# Patient Record
Sex: Male | Born: 1965 | Race: White | Hispanic: No | Marital: Married | Smoking: Former smoker
Health system: Southern US, Community
[De-identification: ages and names within clinical notes are randomized; demographics above are authoritative.]

## PROBLEM LIST (undated history)

## (undated) DIAGNOSIS — E669 Obesity, unspecified: Secondary | ICD-10-CM

## (undated) DIAGNOSIS — E8881 Metabolic syndrome: Secondary | ICD-10-CM

## (undated) DIAGNOSIS — G4730 Sleep apnea, unspecified: Secondary | ICD-10-CM

## (undated) DIAGNOSIS — G4733 Obstructive sleep apnea (adult) (pediatric): Secondary | ICD-10-CM

## (undated) DIAGNOSIS — I10 Essential (primary) hypertension: Secondary | ICD-10-CM

## (undated) HISTORY — DX: Obesity, unspecified: E66.9

## (undated) HISTORY — DX: Obstructive sleep apnea (adult) (pediatric): G47.33

## (undated) HISTORY — DX: Metabolic syndrome: E88.81

## (undated) HISTORY — DX: Essential (primary) hypertension: I10

## (undated) HISTORY — DX: Metabolic syndrome: E88.810

## (undated) HISTORY — DX: Sleep apnea, unspecified: G47.30

---

## 2003-02-08 HISTORY — PX: BACK SURGERY: SHX140

## 2004-03-30 ENCOUNTER — Ambulatory Visit (HOSPITAL_COMMUNITY): Admission: RE | Admit: 2004-03-30 | Discharge: 2004-03-30 | Payer: Self-pay | Admitting: Neurosurgery

## 2006-02-02 ENCOUNTER — Ambulatory Visit: Payer: Self-pay

## 2006-02-02 ENCOUNTER — Ambulatory Visit: Payer: Self-pay | Admitting: Cardiology

## 2014-03-12 ENCOUNTER — Ambulatory Visit (INDEPENDENT_AMBULATORY_CARE_PROVIDER_SITE_OTHER): Payer: Managed Care, Other (non HMO) | Admitting: Family Medicine

## 2014-03-12 VITALS — BP 117/64 | HR 75 | Temp 98.6°F | Ht 69.0 in | Wt 242.4 lb

## 2014-03-12 DIAGNOSIS — R0782 Intercostal pain: Secondary | ICD-10-CM

## 2014-03-12 DIAGNOSIS — M7918 Myalgia, other site: Secondary | ICD-10-CM

## 2014-03-12 MED ORDER — NAPROXEN 500 MG PO TABS: 500.0000 mg | ORAL_TABLET | Freq: Two times a day (BID) | ORAL | Status: DC

## 2014-03-12 NOTE — Progress Notes (Signed)
   Subjective:    Patient ID: Harold Bullock, male    DOB: 02/02/1966, 49 y.o.   MRN: [id number]  HPI Patient is here with c/o left costal discomfort.  Review of Systems  Constitutional: Negative for fever.  HENT: Negative for ear pain.   Eyes: Negative for discharge.  Respiratory: Negative for cough.   Cardiovascular: Negative for chest pain.  Gastrointestinal: Negative for abdominal distention.  Endocrine: Negative for polyuria.  Genitourinary: Negative for difficulty urinating.  Musculoskeletal: Negative for gait problem and neck pain.  Skin: Negative for color change and rash.  Neurological: Negative for speech difficulty and headaches.  Psychiatric/Behavioral: Negative for agitation.       Objective:    BP 117/64 mmHg  Pulse 75  Temp(Src) 98.6 F (37 C) (Oral)  Ht 5\' 9"  (1.753 m)  Wt 242 lb 6 oz (109.941 kg)  BMI 35.78 kg/m2 Physical Exam  +TTP left lateral intercostal space w/o crepitus of stepping off of costal.      Assessment & Plan:     ICD-9-CM ICD-10-CM   1. Intercostal muscle pain 786.59 R07.82 naproxen (NAPROSYN) 500 MG tablet     No Follow-up on file.  Deatra CanterWilliam J Oxford FNP

## 2014-12-13 ENCOUNTER — Ambulatory Visit (INDEPENDENT_AMBULATORY_CARE_PROVIDER_SITE_OTHER): Payer: Managed Care, Other (non HMO) | Admitting: Family Medicine

## 2014-12-13 ENCOUNTER — Encounter: Payer: Self-pay | Admitting: Family Medicine

## 2014-12-13 VITALS — BP 131/72 | HR 82 | Temp 98.5°F | Ht 69.0 in | Wt 223.4 lb

## 2014-12-13 DIAGNOSIS — R05 Cough: Secondary | ICD-10-CM

## 2014-12-13 DIAGNOSIS — H6981 Other specified disorders of Eustachian tube, right ear: Secondary | ICD-10-CM

## 2014-12-13 DIAGNOSIS — B3789 Other sites of candidiasis: Secondary | ICD-10-CM

## 2014-12-13 DIAGNOSIS — J329 Chronic sinusitis, unspecified: Secondary | ICD-10-CM | POA: Diagnosis not present

## 2014-12-13 DIAGNOSIS — R059 Cough, unspecified: Secondary | ICD-10-CM

## 2014-12-13 MED ORDER — AMOXICILLIN 500 MG PO CAPS: 500.0000 mg | ORAL_CAPSULE | Freq: Three times a day (TID) | ORAL | Status: DC

## 2014-12-13 NOTE — Progress Notes (Signed)
Subjective:    Patient ID: Harold Bullock, male    DOB: 05/03/1965, 49 y.o.   MRN: [id number]  HPI  Patient is here today with cough and congestion for about 2 weeks. Patient also feels like there is fluid in his right ear. Patient also states that he has had jock itch also for a couple of months. The patient denies fever. He says the congestion is clear to green in color. He has not had any headaches. He does smoke off and on. The patient has had the rash for a couple of months and his groin area and he complains of the fluid in his ear with his ear feeling like and stopped up. Family history is significant that his father died from complications of a hip fracture and his mother is at an assisted living facility was Alzheimer's.    Review of Systems  Constitutional: Negative.   HENT: Positive for congestion.        Fluid in right ear   Eyes: Negative.   Respiratory: Positive for cough.   Cardiovascular: Negative.   Gastrointestinal: Negative.   Endocrine: Negative.   Musculoskeletal: Negative.   Skin:       Skin irritation (Jock Itch)  Allergic/Immunologic: Negative.   Neurological: Negative.   Hematological: Negative.   Psychiatric/Behavioral: Negative.           There are no active problems to display for this patient.  Outpatient Encounter Prescriptions as of 12/13/2014  Medication Sig  . [DISCONTINUED] naproxen (NAPROSYN) 500 MG tablet Take 1 tablet (500 mg total) by mouth 2 (two) times daily with a meal. (Patient not taking: Reported on 12/13/2014)   No facility-administered encounter medications on file as of 12/13/2014.       Objective:   Physical Exam  Constitutional: He is oriented to person, place, and time. He appears well-developed and well-nourished. No distress.  HENT:  Head: Normocephalic and atraumatic.  Left Ear: External ear normal.  Mouth/Throat: Oropharynx is clear and moist. No oropharyngeal exudate.  Nasal congestion with turbinate swelling  bilaterally. The right TM is retracted. Ear canals are clear of cerumen  Eyes: Conjunctivae and EOM are normal. Pupils are equal, round, and reactive to light. Right eye exhibits no discharge. Left eye exhibits no discharge. No scleral icterus.  Neck: Normal range of motion. Neck supple. No thyromegaly present.  No anterior cervical adenopathy or tenderness  Cardiovascular: Normal rate, regular rhythm and normal heart sounds.   Pulmonary/Chest: Effort normal and breath sounds normal. No respiratory distress. He has no wheezes. He has no rales. He exhibits no tenderness.  Dry cough  Musculoskeletal: Normal range of motion.  Lymphadenopathy:    He has no cervical adenopathy.  Neurological: He is alert and oriented to person, place, and time.  Skin: Skin is warm and dry. Rash noted. There is erythema.  Psychiatric: He has a normal mood and affect. His behavior is normal. Judgment and thought content normal.  Nursing note and vitals reviewed.  BP 131/72 mmHg  Pulse 82  Temp(Src) 98.5 F (36.9 C) (Oral)  Ht  (1.753 m)  Wt 223 lb 6.4 oz (101.334 kg)  BMI 32.98 kg/m2         Assessment & Plan:  1. Rhinosinusitis -Use nasal saline frequently as directed and use Flonase at nighttime as directed - amoxicillin (AMOXIL) 500 MG capsule; Take 1 capsule (500 mg total) by mouth 3 (three) times daily.  Dispense: 30 capsule; Refill: 0  2. Eustachian tube  dysfunction, right -Use nasal saline and Flonase - amoxicillin (AMOXIL) 500 MG capsule; Take 1 capsule (500 mg total) by mouth 3 (three) times daily.  Dispense: 30 capsule; Refill: 0  3. Cough -Take Mucinex with a large glass of water twice daily  4. Candida rash of groin -Use Monistat to the Groin twice daily  Patient Instructions  Try to remain off of the cigarettes Drink plenty of fluids Use nasal saline frequently during the day and take Mucinex, maximum strength, blue and white in color, 1 twice daily with a large glass of water  for cough and congestion Use Flonase for allergic rhinitis 1 spray each nostril at bedtime Take antibiotic as directed until completed Use Monistat vaginal cream and apply sparingly twice daily to the groin area   Nyra Capeson W. Tavis Kring MD

## 2014-12-13 NOTE — Patient Instructions (Addendum)
Try to remain off of the cigarettes Drink plenty of fluids Use nasal saline frequently during the day and take Mucinex, maximum strength, blue and white in color, 1 twice daily with a large glass of water for cough and congestion Use Flonase for allergic rhinitis 1 spray each nostril at bedtime Take antibiotic as directed until completed Use Monistat vaginal cream and apply sparingly twice daily to the groin area

## 2015-01-05 ENCOUNTER — Ambulatory Visit (INDEPENDENT_AMBULATORY_CARE_PROVIDER_SITE_OTHER): Payer: Managed Care, Other (non HMO) | Admitting: Nurse Practitioner

## 2015-01-05 ENCOUNTER — Encounter: Payer: Self-pay | Admitting: Nurse Practitioner

## 2015-01-05 ENCOUNTER — Ambulatory Visit (INDEPENDENT_AMBULATORY_CARE_PROVIDER_SITE_OTHER): Payer: Managed Care, Other (non HMO)

## 2015-01-05 VITALS — BP 132/76 | HR 81 | Temp 97.1°F | Ht 69.0 in | Wt 225.0 lb

## 2015-01-05 DIAGNOSIS — Z23 Encounter for immunization: Secondary | ICD-10-CM

## 2015-01-05 DIAGNOSIS — Z125 Encounter for screening for malignant neoplasm of prostate: Secondary | ICD-10-CM | POA: Diagnosis not present

## 2015-01-05 DIAGNOSIS — Z0000 Encounter for general adult medical examination without abnormal findings: Secondary | ICD-10-CM

## 2015-01-05 DIAGNOSIS — Z6833 Body mass index (BMI) 33.0-33.9, adult: Secondary | ICD-10-CM | POA: Diagnosis not present

## 2015-01-05 DIAGNOSIS — Z1211 Encounter for screening for malignant neoplasm of colon: Secondary | ICD-10-CM | POA: Diagnosis not present

## 2015-01-05 DIAGNOSIS — G4733 Obstructive sleep apnea (adult) (pediatric): Secondary | ICD-10-CM | POA: Diagnosis not present

## 2015-01-05 NOTE — Progress Notes (Addendum)
   Subjective:    Patient ID: Harold Bullock, male    DOB: 10-01-1965, 49 y.o.   MRN: [id number]  HPI  Patient in today for annual physical exam- Has  Not ben to see a physician in several years- he currently has  o medical problems and is on no meds.His only compliant is he can hear his heart beat in his right ear- denies any pain or discharge.  Review of Systems  Constitutional: Negative.   HENT: Negative.   Respiratory: Negative.   Cardiovascular: Negative.   Gastrointestinal: Negative.   Genitourinary: Negative.   Musculoskeletal: Negative.   Neurological: Negative.   Psychiatric/Behavioral: Negative.   All other systems reviewed and are negative.      Objective:   Physical Exam  Constitutional: He is oriented to person, place, and time. He appears well-developed and well-nourished.  HENT:  Head: Normocephalic.  Right Ear: External ear normal.  Left Ear: External ear normal.  Nose: Nose normal.  Mouth/Throat: Oropharynx is clear and moist.  Eyes: EOM are normal. Pupils are equal, round, and reactive to light.  Neck: Normal range of motion. Neck supple. No JVD present. No thyromegaly present.  Cardiovascular: Normal rate, regular rhythm, normal heart sounds and intact distal pulses.  Exam reveals no gallop and no friction rub.   No murmur heard. Pulmonary/Chest: Effort normal and breath sounds normal. No respiratory distress. He has no wheezes. He has no rales. He exhibits no tenderness.  Abdominal: Soft. Bowel sounds are normal. He exhibits no mass. There is no tenderness.  Genitourinary: Prostate normal and penis normal.  Refuse digital rectal exam.  Musculoskeletal: Normal range of motion. He exhibits no edema.  Lymphadenopathy:    He has no cervical adenopathy.  Neurological: He is alert and oriented to person, place, and time. No cranial nerve deficit.  Skin: Skin is warm and dry.  Psychiatric: He has a normal mood and affect. His behavior is normal. Judgment and  thought content normal.   BP 132/76 mmHg  Pulse 81  Temp(Src) 97.1 F (36.2 C) (Oral)  Ht $R'5\' 9"'EL$  (1.753 m)  Wt 225 lb (102.059 kg)  BMI 33.21 kg/m2  Chest x ray- no cardiopulmonary abnormalities-Preliminary reading by Ronnald Collum, FNP  [facility]  EKG- NSR- [name] Hassell Done, FNP       Assessment & Plan:  1. Annual physical exam  - CBC with Differential/Platelet - CMP14+EGFR - Lipid panel - DG Chest 2 View; Future - EKG 12-Lead  2. BMI 33.0-33.9,adult Discussed diet and exercise for person with BMI >25 Will recheck weight in 3-6 months   3. Prostate cancer screening - PSA, total and free  4. Encounter for screening colonoscopy - Ambulatory referral to Gastroenterology  Counseling done on all immunizations given today Referral for sleep study Labs pending Health maintenance reviewed Diet and exercise encouraged Continue all meds Follow up  In 1 year   [place], FNP

## 2015-01-05 NOTE — Patient Instructions (Signed)

## 2015-01-05 NOTE — Addendum Note (Signed)
Addended by: Cleda DaubUCKER, AMANDA G on: 01/05/2015 11:02 AM   Modules accepted: Orders

## 2015-01-05 NOTE — Addendum Note (Signed)
Addended by: Prescott GumLAND, Lenford Beddow M on: 01/05/2015 10:22 AM   Modules accepted: Kipp BroodSmartSet

## 2015-01-05 NOTE — Addendum Note (Signed)
Addended by: Bennie PieriniMARTIN, [name] on: 01/05/2015 10:27 AM   Modules accepted: Kipp BroodSmartSet

## 2015-01-06 ENCOUNTER — Telehealth: Payer: Self-pay | Admitting: Family Medicine

## 2015-01-06 ENCOUNTER — Other Ambulatory Visit: Payer: Self-pay | Admitting: Nurse Practitioner

## 2015-01-06 ENCOUNTER — Encounter: Payer: Self-pay | Admitting: Internal Medicine

## 2015-01-06 LAB — LIPID PANEL
CHOL/HDL RATIO: 5.2 ratio — AB (ref 0.0–5.0)
Cholesterol, Total: 232 mg/dL — ABNORMAL HIGH (ref 100–199)
HDL: 45 mg/dL (ref >39)
LDL CALC: 144 mg/dL — AB (ref 0–99)
TRIGLYCERIDES: 214 mg/dL — AB (ref 0–149)
VLDL Cholesterol Cal: 43 mg/dL — ABNORMAL HIGH (ref 5–40)

## 2015-01-06 LAB — CBC WITH DIFFERENTIAL/PLATELET
BASOS: 1 %
Basophils Absolute: 0 10*3/uL (ref 0.0–0.2)
EOS (ABSOLUTE): 0.1 10*3/uL (ref 0.0–0.4)
Eos: 1 %
HEMATOCRIT: 42.4 % (ref 37.5–51.0)
HEMOGLOBIN: 14.3 g/dL (ref 12.6–17.7)
Immature Grans (Abs): 0 10*3/uL (ref 0.0–0.1)
Immature Granulocytes: 0 %
LYMPHS ABS: 0.8 10*3/uL (ref 0.7–3.1)
LYMPHS: 15 %
MCH: 32.5 pg (ref 26.6–33.0)
MCHC: 33.7 g/dL (ref 31.5–35.7)
MCV: 96 fL (ref 79–97)
MONOS ABS: 0.3 10*3/uL (ref 0.1–0.9)
Monocytes: 7 %
Neutrophils Absolute: 3.9 10*3/uL (ref 1.4–7.0)
Neutrophils: 76 %
Platelets: 230 10*3/uL (ref 150–379)
RBC: 4.4 x10E6/uL (ref 4.14–5.80)
RDW: 13.2 % (ref 12.3–15.4)
WBC: 5 10*3/uL (ref 3.4–10.8)

## 2015-01-06 LAB — PSA, TOTAL AND FREE
PSA FREE PCT: 57.5 %
PSA FREE: 0.23 ng/mL
Prostate Specific Ag, Serum: 0.4 ng/mL (ref 0.0–4.0)

## 2015-01-06 LAB — CMP14+EGFR
A/G RATIO: 1.8 (ref 1.1–2.5)
ALBUMIN: 4.6 g/dL (ref 3.5–5.5)
ALK PHOS: 91 IU/L (ref 39–117)
ALT: 30 IU/L (ref 0–44)
AST: 22 IU/L (ref 0–40)
BUN / CREAT RATIO: 14 (ref 9–20)
BUN: 13 mg/dL (ref 6–24)
Bilirubin Total: 0.3 mg/dL (ref 0.0–1.2)
CALCIUM: 10.1 mg/dL (ref 8.7–10.2)
CO2: 24 mmol/L (ref 18–29)
CREATININE: 0.94 mg/dL (ref 0.76–1.27)
Chloride: 97 mmol/L (ref 97–106)
GFR calc Af Amer: 109 mL/min/{1.73_m2} (ref >59)
GFR, EST NON AFRICAN AMERICAN: 94 mL/min/{1.73_m2} (ref >59)
GLOBULIN, TOTAL: 2.5 g/dL (ref 1.5–4.5)
Glucose: 119 mg/dL — ABNORMAL HIGH (ref 65–99)
POTASSIUM: 4.4 mmol/L (ref 3.5–5.2)
SODIUM: 138 mmol/L (ref 136–144)
Total Protein: 7.1 g/dL (ref 6.0–8.5)

## 2015-01-06 MED ORDER — ATORVASTATIN CALCIUM 40 MG PO TABS: 40.0000 mg | ORAL_TABLET | Freq: Every day | ORAL | Status: DC

## 2015-01-07 ENCOUNTER — Encounter: Payer: Self-pay | Admitting: Family Medicine

## 2015-02-12 ENCOUNTER — Encounter: Payer: Self-pay | Admitting: Internal Medicine

## 2015-02-12 ENCOUNTER — Encounter: Payer: Self-pay | Admitting: Nurse Practitioner

## 2015-02-12 ENCOUNTER — Ambulatory Visit (INDEPENDENT_AMBULATORY_CARE_PROVIDER_SITE_OTHER): Payer: Managed Care, Other (non HMO) | Admitting: Nurse Practitioner

## 2015-02-12 VITALS — BP 134/82 | HR 87 | Temp 96.9°F | Ht 69.0 in | Wt 226.0 lb

## 2015-02-12 DIAGNOSIS — B356 Tinea cruris: Secondary | ICD-10-CM

## 2015-02-12 MED ORDER — NYSTATIN 100000 UNIT/GM EX CREA: 1.0000 "application " | TOPICAL_CREAM | Freq: Two times a day (BID) | CUTANEOUS | Status: DC

## 2015-02-12 MED ORDER — FLUCONAZOLE 150 MG PO TABS: ORAL_TABLET | ORAL | Status: DC

## 2015-02-12 NOTE — Patient Instructions (Signed)
Jock Itch  Jock itch is an infection of the skin in the groin area. It is caused by a type of germ (fungus). The infection causes a rash and itching in the groin and upper thigh. It is common in people who play sports. Being in places with hot weather and wearing tight or wet clothes can increase the chance of getting jock itch. The rash usually goes away in 2-3 weeks with treatment.  HOME CARE  · Take medicines only as told by your doctor. Apply skin creams or ointments exactly as told.  · Wear loose-fitting clothing.    Men should wear cotton boxer shorts.    Women should wear cotton underwear.  · Change your underwear every day to keep your groin dry.  · Avoid hot baths.  · Dry your groin area well after you take a bath or shower.    Use a separate towel to dry your groin area. This will help to prevent a spreading of the infection to other areas of your body.  · Do not scratch the affected area.  · Do not share towels with other people.  GET HELP IF:  · Your rash does not improve or it gets worse after 2 weeks of treatment.  · Your rash is spreading.  · Your rash comes back after treatment is finished.  · You have a fever.  · You have redness, swelling, or pain in the area around your rash.  · You have fluid, blood, or pus coming from your rash.  · Your have your rash for more than 4 weeks.     This information is not intended to replace advice given to you by your health care provider. Make sure you discuss any questions you have with your health care provider.     Document Released: 04/20/2009 Document Revised: 02/14/2014 Document Reviewed: 11/05/2013  Elsevier Interactive Patient Education ©2016 [employer]

## 2015-02-12 NOTE — Progress Notes (Signed)
   Subjective:    Patient ID: Harold Bullock, male    DOB: 05/18/1965, 50 y.o.   MRN: [id number]  HPI  Patient in c/o rash in groin area- he has had since September- Was seen a couple of months ago and told him to use vagasil topically on it- made it worse- has been using gold bond which helps some with itching.   Review of Systems  Constitutional: Negative.   HENT: Negative.   Respiratory: Negative.   Cardiovascular: Negative.   Gastrointestinal: Negative.   Genitourinary: Negative.   Neurological: Negative.   Psychiatric/Behavioral: Negative.   All other systems reviewed and are negative.      Objective:   Physical Exam  Constitutional: He appears well-developed and well-nourished.  Cardiovascular: Normal rate, regular rhythm and normal heart sounds.   Pulmonary/Chest: Effort normal and breath sounds normal.  Skin: Skin is warm. Rash noted.  Erythematous wet rash in bil groin area   BP 134/82 mmHg  Pulse 87  Temp(Src) 96.9 F (36.1 C) (Oral)  Ht 5\' 9"  (1.753 m)  Wt 226 lb (102.513 kg)  BMI 33.36 kg/m2         Assessment & Plan:  1. Jock itch Keep area as dry as possible Avoid scratching - nystatin cream (MYCOSTATIN); Apply 1 application topically 2 (two) times daily.  Dispense: 30 g; Refill: 0 - fluconazole (DIFLUCAN) 150 MG tablet; 1 po q week x 4 weeks  Dispense: 4 tablet; Refill: 0  [name] Daphine DeutscherMartin, FNP

## 2015-02-23 ENCOUNTER — Other Ambulatory Visit: Payer: Self-pay | Admitting: Nurse Practitioner

## 2015-03-02 ENCOUNTER — Encounter: Payer: Self-pay | Admitting: Internal Medicine

## 2015-04-13 ENCOUNTER — Encounter: Payer: Self-pay | Admitting: Internal Medicine

## 2015-04-22 ENCOUNTER — Encounter: Payer: Self-pay | Admitting: Pulmonary Disease

## 2015-04-22 ENCOUNTER — Ambulatory Visit (INDEPENDENT_AMBULATORY_CARE_PROVIDER_SITE_OTHER): Payer: Managed Care, Other (non HMO) | Admitting: Pulmonary Disease

## 2015-04-22 VITALS — BP 148/86 | HR 76 | Ht 69.0 in | Wt 232.0 lb

## 2015-04-22 DIAGNOSIS — G4733 Obstructive sleep apnea (adult) (pediatric): Secondary | ICD-10-CM

## 2015-04-22 DIAGNOSIS — Z23 Encounter for immunization: Secondary | ICD-10-CM | POA: Diagnosis not present

## 2015-04-22 NOTE — Progress Notes (Signed)
   Subjective:    Patient ID: Harold Bullock, male    DOB: 03/12/1965, 50 y.o.   MRN: [id number]  HPI    Review of Systems  Constitutional: Negative for fever and unexpected weight change.  HENT: Negative for congestion, dental problem, ear pain, nosebleeds, postnasal drip, rhinorrhea, sinus pressure, sneezing, sore throat and trouble swallowing.   Eyes: Negative for redness and itching.  Respiratory: Negative for cough, chest tightness, shortness of breath and wheezing.   Cardiovascular: Negative for palpitations and leg swelling.  Gastrointestinal: Negative for nausea and vomiting.  Genitourinary: Negative for dysuria.  Musculoskeletal: Negative for joint swelling.  Skin: Negative for rash.  Neurological: Negative for headaches.  Hematological: Does not bruise/bleed easily.  Psychiatric/Behavioral: Negative for dysphoric mood. The patient is not nervous/anxious.        Objective:   Physical Exam        Assessment & Plan:

## 2015-04-22 NOTE — Progress Notes (Signed)
Past Medical History He  has a past medical history of Sleep apnea.  Past Surgical History He  has past surgical history that includes Back surgery (2005 ).  No current outpatient prescriptions on file prior to visit.   No current facility-administered medications on file prior to visit.    No Known Allergies  Family History His family history includes Alzheimer's disease in his mother; Cancer in his maternal grandfather, maternal uncle, and paternal grandmother.  Social History He  reports that he has been smoking Cigarettes.  He has a 20 pack-year smoking history. He does not have any smokeless tobacco history on file. He reports that he drinks alcohol. He reports that he does not use illicit drugs.  Review of systems Constitutional: Negative for fever and unexpected weight change.  HENT: Negative for congestion, dental problem, ear pain, nosebleeds, postnasal drip, rhinorrhea, sinus pressure, sneezing, sore throat and trouble swallowing.   Eyes: Negative for redness and itching.  Respiratory: Negative for cough, chest tightness, shortness of breath and wheezing.   Cardiovascular: Negative for palpitations and leg swelling.  Gastrointestinal: Negative for nausea and vomiting.  Genitourinary: Negative for dysuria.  Musculoskeletal: Negative for joint swelling.  Skin: Negative for rash.  Neurological: Negative for headaches.  Hematological: Does not bruise/bleed easily.  Psychiatric/Behavioral: Negative for dysphoric mood. The patient is not nervous/anxious.     Chief Complaint  Patient presents with  . SLEEP CONSULT    Referred by Dr [name]. Uses CPAP nihgtly. Needs new DME. Sleep study 10+years ago. Epworth Score: 10    Tests:  Vital signs BP 148/86 mmHg  Pulse 76  Ht  (1.753 m)  Wt 232 lb (105.235 kg)  BMI 34.24 kg/m2  SpO2 97%  History of Present Illness Harold Bullock is a 50 y.o. male for evaluation of sleep problems.  He had sleep study about 10 yrs  ago at [facility]  He has been using CPAP since.  His current machine is about 81 or 51 years old.  He gets his supplies on line, but has not gotten a new mask for some time.  His sleep is okay with CPAP.  Before CPAP he would have snoring, apnea, and frequent arousals.  He would also talk in his sleep and grind his teeth. He used to get jumpy legs, but not recently.  He goes to sleep at 10 pm.  He falls asleep quickly.  He wakes up some times to use the bathroom.  He gets out of bed at 530 am.  He feels rested in the morning.  He denies morning headache.  He does not use anything to help him fall sleep or stay awake.  He denies sleep walking, or nightmares.  There is no history of restless legs.  He denies sleep hallucinations, sleep paralysis, or cataplexy.  The Epworth score is 10 out of 24.   Physical Exam:  General - No distress ENT - No sinus tenderness, no oral exudate, no LAN, no thyromegaly, TM clear, pupils equal/reactive, MP 3, enlarged tongue, high arched palate Cardiac - s1s2 regular, no murmur, pulses symmetric Chest - No wheeze/rales/dullness, good air entry, normal respiratory excursion Back - No focal tenderness Abd - Soft, non-tender, no organomegaly, + bowel sounds Ext - No edema Neuro - Normal strength, cranial nerves intact Skin - No rashes Psych - Normal mood, and behavior  Discussion: He has hx of obstructive sleep apnea.  He has been using CPAP, but his device is quite old.  He will  need to have re-assessment of the status of his sleep apnea, and then arrange for new machine and supplies.  We discussed how sleep apnea can affect various health problems, including risks for hypertension, cardiovascular disease, and diabetes.  We also discussed how sleep disruption can increase risks for accidents, such as while driving.  Weight loss as a means of improving sleep apnea was also reviewed.  Additional treatment options discussed were CPAP therapy, oral appliance, and  surgical intervention.   Assessment/plan:  Obstructive sleep apnea. Plan: - will arrange for home sleep study - assuming he still has sleep apnea, will then arrange for new auto CPAP machine and new supplies >> will need to set him up with DME for supplies    Patient Instructions  Will arrange for home sleep study Will call to arrange for follow up after sleep study reviewed      Coralyn HellingVineet Tachina Spoonemore, M.D. Pager [phone]

## 2015-04-22 NOTE — Patient Instructions (Signed)
Will arrange for home sleep study Will call to arrange for follow up after sleep study reviewed  

## 2015-04-24 ENCOUNTER — Telehealth: Payer: Self-pay | Admitting: Pulmonary Disease

## 2015-04-24 NOTE — Telephone Encounter (Signed)
LMTCB x 1 

## 2015-04-27 NOTE — Telephone Encounter (Signed)
Spoke with [place] Monmarya and advised it was a home sleep study that was ordered  She states this is all she needed  Will close encounter

## 2015-05-06 ENCOUNTER — Telehealth: Payer: Self-pay | Admitting: Pulmonary Disease

## 2015-05-06 NOTE — Telephone Encounter (Signed)
I called patient to get his HST rescheduled.  Pt states it is hard to come pick up the HST machine due to his work schedule.  I offered for his spouse/significant other to come pick it up for him.  Patient will check with spouse/significant other to see if she would feel comfortable doing this and call me back to let me know.

## 2015-05-13 NOTE — Telephone Encounter (Signed)
Pts wife Harold StanleyLisa called and scheduled appt for 05/20/15 to pick HST machine for patient.

## 2015-05-20 DIAGNOSIS — G4733 Obstructive sleep apnea (adult) (pediatric): Secondary | ICD-10-CM

## 2015-05-21 ENCOUNTER — Other Ambulatory Visit: Payer: Self-pay | Admitting: *Deleted

## 2015-05-21 ENCOUNTER — Telehealth: Payer: Self-pay | Admitting: Pulmonary Disease

## 2015-05-21 ENCOUNTER — Encounter: Payer: Self-pay | Admitting: Pulmonary Disease

## 2015-05-21 DIAGNOSIS — G4733 Obstructive sleep apnea (adult) (pediatric): Secondary | ICD-10-CM | POA: Insufficient documentation

## 2015-05-21 HISTORY — DX: Obstructive sleep apnea (adult) (pediatric): G47.33

## 2015-05-21 NOTE — Telephone Encounter (Signed)
HST 05/20/15 >> AHI 67.9, SaO2 low 71%  Will have my nurse inform pt that sleep study shows severe sleep apnea.  Options are 1) CPAP now, 2) ROV first.  If He is agreeable to CPAP, then please send order for auto CPAP range 5 to 15 cm H2O with heated humidity and mask of choice.  Have download sent 1 month after starting CPAP and set up ROV 2 months after starting CPAP.  ROV can by with me or nurse practitioner.

## 2015-05-26 NOTE — Telephone Encounter (Signed)
ATC pt x 1 Mailbox not set up.  wcb

## 2015-06-01 NOTE — Telephone Encounter (Signed)
ATC x2  Unable to leave voicemail - vmail not activated. wcb

## 2015-06-04 ENCOUNTER — Telehealth: Payer: Self-pay | Admitting: Pulmonary Disease

## 2015-06-04 NOTE — Telephone Encounter (Signed)
Pt calling wanting to know how many times he quit breathing during the night Harold HellingVineet Sood, MD at 05/21/2015 10:07 AM     Status: Signed       Expand All Collapse All   HST 05/20/15 >> AHI 67.9, SaO2 low 71%  Will have my nurse inform pt that sleep study shows severe sleep apnea.      Discussed details given by VS.  Nothing further needed.

## 2015-06-04 NOTE — Telephone Encounter (Signed)
ATC x 3 Unable to leave voicemail - vmail not activated. wcb  Called pt on cell #, requests that we do not call him on his home #, only cell. Numbers have been switched around in chart according to priority. Results have been explained to patient, pt expressed understanding.  Order placed for CPAP machine. Appt schedule with TP for 69mo ROV for CPAP compliance 08/10/15 at 9:15am. Nothing further needed.

## 2015-08-10 ENCOUNTER — Encounter (INDEPENDENT_AMBULATORY_CARE_PROVIDER_SITE_OTHER): Payer: Self-pay

## 2015-08-10 ENCOUNTER — Ambulatory Visit (INDEPENDENT_AMBULATORY_CARE_PROVIDER_SITE_OTHER): Payer: Managed Care, Other (non HMO) | Admitting: Adult Health

## 2015-08-10 ENCOUNTER — Encounter: Payer: Self-pay | Admitting: Adult Health

## 2015-08-10 VITALS — BP 140/80 | HR 82 | Ht 69.0 in | Wt 227.0 lb

## 2015-08-10 DIAGNOSIS — G4733 Obstructive sleep apnea (adult) (pediatric): Secondary | ICD-10-CM | POA: Diagnosis not present

## 2015-08-10 NOTE — Patient Instructions (Signed)
Continue on CPAP At bedtime   Keep up great job Work on weight loss.  Do not drive if sleepy  Follow up Dr. Craige CottaSood  In 1 year and As needed

## 2015-08-10 NOTE — Progress Notes (Signed)
Subjective:    Patient ID: Harold OverlieJohn K Bullock, male    DOB: 07/13/1965, 50 y.o.   MRN: [id number]  HPI 50 year old male seen for sleep consult March 2017, she had known sleep apnea on C Pap for many years. He needed a new C Pap machine  TEST  HST 05/20/15 >> AHI 67.9, SaO2 low 71%   08/10/2015 follow-up sleep apnea Patient returns for a four-month follow-up. Patient was recently seen establish for sleep apnea.  He had been on C Pap for many years. He needed a new C Pap machine. Patient was set up for a home sleep study 05/20/2015 that showed severe sleep apnea with AHI 67.9, SaO2 low at 71%.  Patient was set up with a new C Pap machine on AutoSet 5-15 cm of H2O. Download shows excellent compliance with 100% usage. On average. Uses at 7.5 hours. AHI 0.9. Minimal leaks.  Says he is doing very well on C Pap. He feels rested.  He denies any chest pain, orthopnea, PND, or increased leg swelling.   Past Medical History  Diagnosis Date  . Sleep apnea   . OSA (obstructive sleep apnea) 05/21/2015   Current Outpatient Prescriptions on File Prior to Visit  Medication Sig Dispense Refill  . atorvastatin (LIPITOR) 40 MG tablet Take 40 mg by mouth daily.  1   No current facility-administered medications on file prior to visit.     Review of Systems Constitutional:   No  weight loss, night sweats,  Fevers, chills, fatigue, or  lassitude.  HEENT:   No headaches,  Difficulty swallowing,  Tooth/dental problems, or  Sore throat,                No sneezing, itching, ear ache, nasal congestion, post nasal drip,   CV:  No chest pain,  Orthopnea, PND, swelling in lower extremities, anasarca, dizziness, palpitations, syncope.   GI  No heartburn, indigestion, abdominal pain, nausea, vomiting, diarrhea, change in bowel habits, loss of appetite, bloody stools.   Resp: No shortness of breath with exertion or at rest.  No excess mucus, no productive cough,  No non-productive cough,  No coughing up of blood.   No change in color of mucus.  No wheezing.  No chest wall deformity  Skin: no rash or lesions.  GU: no dysuria, change in color of urine, no urgency or frequency.  No flank pain, no hematuria   MS:  No joint pain or swelling.  No decreased range of motion.  No back pain.  Psych:  No change in mood or affect. No depression or anxiety.  No memory loss.         Objective:   Physical Exam Filed Vitals:   08/10/15 0922  BP: 140/80  Pulse: 82  Height: 5\' 9"  (1.753 m)  Weight: 227 lb (102.967 kg)  SpO2: 97%  Body mass index is 33.51 kg/(m^2).   GEN: A/Ox3; pleasant , NAD, obese   HEENT:  [place]/AT,  EACs-clear, TMs-wnl, NOSE-clear, THROAT-clear, no lesions, no postnasal drip or exudate noted. Class 3 MP airway   NECK:  Supple w/ fair ROM; no JVD; normal carotid impulses w/o bruits; no thyromegaly or nodules palpated; no lymphadenopathy.  RESP  Clear  P & A; w/o, wheezes/ rales/ or rhonchi.no accessory muscle use, no dullness to percussion  CARD:  RRR, no m/r/g  , no peripheral edema, pulses intact, no cyanosis or clubbing.  GI:   Soft & nt; nml bowel sounds; no organomegaly or masses detected.  Musco: Warm bil, no deformities or joint swelling noted.   Neuro: alert, no focal deficits noted.    Skin: Warm, no lesions or rashes  Tammy Parrett NP-C  [facility]  [phone]         Assessment & Plan:

## 2015-08-10 NOTE — Assessment & Plan Note (Signed)
Severe OSA well controlled  Plan  Continue on CPAP At bedtime   Keep up great job Work on weight loss.  Do not drive if sleepy  Follow up Dr. Craige CottaSood  In 1 year and As needed

## 2015-08-14 ENCOUNTER — Other Ambulatory Visit: Payer: Self-pay | Admitting: Nurse Practitioner

## 2015-08-16 NOTE — Progress Notes (Signed)
Reviewed and agree with assessment/plan.  Seidy Labreck, MD [facility] 08/16/2015, 6:50 PM Pager:  [phone]  

## 2015-08-19 ENCOUNTER — Encounter: Payer: Self-pay | Admitting: Adult Health

## 2016-03-29 ENCOUNTER — Encounter: Payer: Self-pay | Admitting: Nurse Practitioner

## 2016-03-29 ENCOUNTER — Ambulatory Visit (INDEPENDENT_AMBULATORY_CARE_PROVIDER_SITE_OTHER): Payer: 59 | Admitting: Nurse Practitioner

## 2016-03-29 VITALS — BP 127/74 | HR 78 | Temp 97.2°F | Ht 69.0 in | Wt 226.0 lb

## 2016-03-29 DIAGNOSIS — J0101 Acute recurrent maxillary sinusitis: Secondary | ICD-10-CM | POA: Diagnosis not present

## 2016-03-29 MED ORDER — CHLORPHEN-PE-ACETAMINOPHEN 4-10-325 MG PO TABS: 1.0000 | ORAL_TABLET | Freq: Four times a day (QID) | ORAL | 0 refills | Status: DC | PRN

## 2016-03-29 MED ORDER — AZITHROMYCIN 250 MG PO TABS: ORAL_TABLET | ORAL | 0 refills | Status: DC

## 2016-03-29 NOTE — Progress Notes (Signed)
Subjective:     Harold Bullock is a 51 y.o. male who presents for evaluation of sinus pain. Symptoms include: congestion, cough, facial pain, headaches, nasal congestion, post nasal drip and sinus pressure. Onset of symptoms was 2 days ago. Symptoms have been gradually worsening since that time. Past history is significant for occasional episodes of bronchitis. Patient is a smoker  (1 ppd x 20 yrs).  The following portions of the patient's history were reviewed and updated as appropriate: allergies, current medications, past family history, past medical history, past social history, past surgical history and problem list.  Review of Systems Pertinent items noted in HPI and remainder of comprehensive ROS otherwise negative.   Objective:    BP 127/74   Pulse 78   Temp 97.2 F (36.2 C) (Oral)   Ht 5\' 9"  (1.753 m)   Wt 226 lb (102.5 kg)   BMI 33.37 kg/m  General appearance: alert, cooperative and mild distress Eyes: conjunctivae/corneas clear. PERRL, EOM's intact. Fundi benign. Ears: normal TM's and external ear canals both ears Nose: clear discharge, moderate congestion, turbinates red, sinus tenderness bilateral Throat: lips, mucosa, and tongue normal; teeth and gums normal Neck: no adenopathy, no carotid bruit, no JVD, supple, symmetrical, trachea midline and thyroid not enlarged, symmetric, no tenderness/mass/nodules Lungs: clear to auscultation bilaterally Heart: regular rate and rhythm, S1, S2 normal, no murmur, click, rub or gallop    Assessment:    Acute bacterial sinusitis.    Plan:     1. Take meds as prescribed 2. Use a cool mist humidifier especially during the winter months and when heat has been humid. 3. Use saline nose sprays frequently 4. Saline irrigations of the nose can be very helpful if done frequently.  * 4X daily for 1 week*  * Use of a nettie pot can be helpful with this. Follow directions with this* 5. Drink plenty of fluids 6. Keep thermostat turn  down low 7.For any cough or congestion  Use plain Mucinex- regular strength or max strength is fine   * Children- consult with Pharmacist for dosing 8. For fever or aces or pains- take tylenol or ibuprofen appropriate for age and weight.  * for fevers greater than 101 orally you may alternate ibuprofen and tylenol every  3 hours.   Meds ordered this encounter  Medications  . azithromycin (ZITHROMAX) 250 MG tablet    Sig: Two tablets day one, then one tablet daily next 4 days.    Dispense:  6 tablet    Refill:  0    Order Specific Question:   Supervising Provider    Answer:   VINCENT, CAROL L [[id number]]  . Chlorphen-PE-Acetaminophen 4-10-325 MG TABS    Sig: Take 1 tablet by mouth every 6 (six) hours as needed.    Dispense:  10 tablet    Refill:  0    Order Specific Question:   Supervising Provider    Answer:   Johna SheriffVINCENT, CAROL L [[id number]]   [name] Daphine DeutscherMartin, FNP

## 2016-03-29 NOTE — Patient Instructions (Signed)

## 2016-10-04 ENCOUNTER — Encounter: Payer: Self-pay | Admitting: Family Medicine

## 2016-10-04 ENCOUNTER — Ambulatory Visit (INDEPENDENT_AMBULATORY_CARE_PROVIDER_SITE_OTHER): Payer: 59

## 2016-10-04 ENCOUNTER — Ambulatory Visit (INDEPENDENT_AMBULATORY_CARE_PROVIDER_SITE_OTHER): Payer: 59 | Admitting: Family Medicine

## 2016-10-04 VITALS — BP 138/77 | HR 80 | Temp 97.6°F | Ht 69.0 in | Wt 226.4 lb

## 2016-10-04 DIAGNOSIS — M25562 Pain in left knee: Secondary | ICD-10-CM | POA: Diagnosis not present

## 2016-10-04 MED ORDER — METHYLPREDNISOLONE ACETATE 80 MG/ML IJ SUSP
80.0000 mg | Freq: Once | INTRAMUSCULAR | Status: AC
  Administered 2016-10-04: 80 mg via INTRAMUSCULAR

## 2016-10-04 NOTE — Patient Instructions (Signed)
Great to meet you!  Please let me know if you would like to have a referral.   I expect you will have improvement in pain over the next 12 hours, this will often help

## 2016-10-04 NOTE — Progress Notes (Signed)
   HPI  Patient presents today with knee pain.  Asian states for the last 2 months or so he's had medial and anterior knee pain with a deep squat.  Patient has some intermittent popping symptoms. He has mild swelling at times. He uses Aleve every day at night for back pain which has been going on for years. He denies any discrete injury that states that he had symptoms started after he was building a shed 2 months ago.  PMH: Smoking status noted ROS: Per HPI  Objective: BP 138/77   Pulse 80   Temp 97.6 F (36.4 C) (Oral)   Ht 5\' 9"  (1.753 m)   Wt 226 lb 6.4 oz (102.7 kg)   BMI 33.43 kg/m  Gen: NAD, alert, cooperative with exam HEENT: NCAT CV: RRR, good S1/S2, no murmur Resp: CTABL, no wheezes, non-labored Ext: No edema, warm Neuro: Alert and oriented, No gross deficits MSK: L knee without erythema, effusion, bruising, or gross deformity + medial  joint line tenderness.  ligamentously intact to Lachman's and with varus and valgus stress.  Negative McMurray's test    Informed consent obtained and placed in chart.    Area cleaned with iodine x 2 and wiped clear with alcohol swab.  Using 21 1/2 gauge needle 1 cc Kenalog and 3 cc's 1% Lidocaine were injected in knee via medial approach.  Sterile bandage placed.  Patient tolerated procedure well.  No complications.     Assessment and plan:  # Left knee pain Acute persistent left knee pain, worse with deep squat, with some medial joint line tenderness as well as intermittent but mild hopping symptoms Plain film appears normal on my read Treated with 80 mg Depo-Medrol injection as above.      Orders Placed This Encounter  Procedures  . DG Knee 1-2 Views Left    Standing Status:   Future    Number of Occurrences:   1    Standing Expiration Date:   12/04/2017    Order Specific Question:   Reason for Exam (SYMPTOM  OR DIAGNOSIS REQUIRED)    Answer:   L knee pain    Order Specific Question:   Preferred imaging  location?    Answer:   Internal    Meds ordered this encounter  Medications  . methylPREDNISolone acetate (DEPO-MEDROL) injection 80 mg    Murtis Sink, MD [facility] 10/04/2016, 5:04 PM

## 2016-12-16 ENCOUNTER — Encounter: Payer: Self-pay | Admitting: Pulmonary Disease

## 2016-12-16 ENCOUNTER — Ambulatory Visit (INDEPENDENT_AMBULATORY_CARE_PROVIDER_SITE_OTHER): Payer: 59 | Admitting: Pulmonary Disease

## 2016-12-16 VITALS — BP 138/80 | HR 81 | Ht 69.0 in | Wt 223.8 lb

## 2016-12-16 DIAGNOSIS — G4700 Insomnia, unspecified: Secondary | ICD-10-CM | POA: Diagnosis not present

## 2016-12-16 DIAGNOSIS — G4733 Obstructive sleep apnea (adult) (pediatric): Secondary | ICD-10-CM | POA: Diagnosis not present

## 2016-12-16 MED ORDER — ZALEPLON 5 MG PO CAPS: 5.0000 mg | ORAL_CAPSULE | Freq: Every evening | ORAL | 3 refills | Status: DC | PRN

## 2016-12-16 NOTE — Progress Notes (Signed)
No current outpatient medications on file prior to visit.   No current facility-administered medications on file prior to visit.      Chief Complaint  Patient presents with  . Follow-up    Pt is getting up in the middle of the night to use the restroom, once that happens it takes another 2 hours to fall back asleep.      Sleep tests HST 05/20/15 >> AHI 67.9, SaO2 low 71% Auto CPAP 11/14/16 to 12/13/16 >> used on 30 of 30 nights with average 7 hrs 50 min.  Average AHI 1 with median CPAP 9 and 95 th percentile CPAP 11 cm H2O  Past medical history, Past surgical history, Family history, Social history, Allergies all reviewed.  Vital Signs BP 138/80 (BP Location: Left Arm, Cuff Size: Normal)   Pulse 81   Ht 5\' 9"  (1.753 m)   Wt 223 lb 12.8 oz (101.5 kg)   SpO2 96%   BMI 33.05 kg/m   History of Present Illness Harold OverlieJohn K Casares is a 51 y.o. male with obstructive sleep apnea.  He is doing well with CPAP.  Uses nightly.  Helps his alertness during the day.  No issues with mask fit.  He goes to bed at 930 pm.  He has no trouble falling asleep.  He wakes up around 130 or 230 am, and then has trouble falling back to sleep.  He gets up at 530 am to go to work.  Physical Exam  General - pleasant Eyes - pupils reactive ENT - no sinus tenderness, no oral exudate, no LAN Cardiac - regular, no murmur Chest - no wheeze, rales Abd - soft, non tender Ext - no edema Skin - no rashes Neuro - normal strength Psych - normal mood    Assessment/Plan  Obstructive sleep apnea. - he is compliant with CPAP and reports benefit from therapy - continue auto CPAP  Sleep maintenance insomnia with awakenings from sleep. - will have him try low dose sonata prn - discussed that he should allow adequate time to sleep after he takes the medication, and should use caustion with driving until he gets a sense how he responds to the medication   Patient Instructions  Sonata 5 mg pill >> can use as  needed on nights that you have trouble falling back to sleep  Will show you how to set up MyChart  Follow up in 1 year   Coralyn HellingVineet Anice Wilshire, MD [place] [facility] Pager:  [phone] 12/16/2016, 2:03 PM

## 2016-12-16 NOTE — Patient Instructions (Signed)
Sonata 5 mg pill >> can use as needed on nights that you have trouble falling back to sleep  Will show you how to set up MyChart  Follow up in 1 year

## 2017-02-10 ENCOUNTER — Ambulatory Visit (INDEPENDENT_AMBULATORY_CARE_PROVIDER_SITE_OTHER): Payer: Managed Care, Other (non HMO) | Admitting: Family Medicine

## 2017-02-10 VITALS — BP 130/85 | HR 72 | Temp 98.8°F | Ht 69.0 in | Wt 229.0 lb

## 2017-02-10 DIAGNOSIS — R3989 Other symptoms and signs involving the genitourinary system: Secondary | ICD-10-CM | POA: Diagnosis not present

## 2017-02-10 DIAGNOSIS — Z1211 Encounter for screening for malignant neoplasm of colon: Secondary | ICD-10-CM | POA: Diagnosis not present

## 2017-02-10 LAB — URINALYSIS
Bilirubin, UA: NEGATIVE
GLUCOSE, UA: NEGATIVE
Ketones, UA: NEGATIVE
Leukocytes, UA: NEGATIVE
Nitrite, UA: NEGATIVE
Protein, UA: NEGATIVE
RBC UA: NEGATIVE
Specific Gravity, UA: 1.025 (ref 1.005–1.030)
UUROB: 0.2 mg/dL (ref 0.2–1.0)
pH, UA: 6.5 (ref 5.0–7.5)

## 2017-02-10 NOTE — Progress Notes (Signed)
Subjective: CC: urinary pressure PCP: Timmothy Euler, MD Harold Bullock is a 52 y.o. male presenting to clinic today for:  1. Urinary symptoms Patient reports a one-month h/o urinary/bladder pressure.  Denies urinary frequency, urgency, hematuria, fevers, chills, abdominal pain, nausea, vomiting, back pain.  Denies penile discharge or lesions.  He reports some difficulty with obtaining and maintaining an erection.  Patient has used nothing for symptoms.  Patient denies a h/o frequent or recurrent UTIs.  Family history significant for prostate cancer in an uncle in his 59s.  Otherwise denies history of ovarian, colon, breast cancers.  ROS: Per HPI  No Known Allergies Past Medical History:  Diagnosis Date  . OSA (obstructive sleep apnea) 05/21/2015  . Sleep apnea     Current Outpatient Medications:  .  zaleplon (SONATA) 5 MG capsule, Take 1 capsule (5 mg total) at bedtime as needed by mouth for sleep. (Patient not taking: Reported on 02/10/2017), Disp: 30 capsule, Rfl: 3 Social History   Socioeconomic History  . Marital status: Married    Spouse name: Not on file  . Number of children: Not on file  . Years of education: Not on file  . Highest education level: Not on file  Social Needs  . Financial resource strain: Not on file  . Food insecurity - worry: Not on file  . Food insecurity - inability: Not on file  . Transportation needs - medical: Not on file  . Transportation needs - non-medical: Not on file  Occupational History  . Occupation: Truck Geophysicist/field seismologist  Tobacco Use  . Smoking status: Former Smoker    Packs/day: 1.00    Years: 20.00    Pack years: 20.00    Types: Cigarettes    Last attempt to quit: 05/27/2016    Years since quitting: 0.7  . Smokeless tobacco: Never Used  . Tobacco comment: reports "off and on"  Substance and Sexual Activity  . Alcohol use: Yes    Alcohol/week: 0.0 oz    Comment: 6 beers/daily  . Drug use: No  . Sexual activity: Not on file    Other Topics Concern  . Not on file  Social History Narrative  . Not on file   Family History  Problem Relation Age of Onset  . Alzheimer's disease Mother   . Cancer Maternal Grandfather   . Cancer Maternal Uncle   . Cancer Paternal Grandmother     Objective: Office vital signs reviewed. BP 130/85   Pulse 72   Temp 98.8 F (37.1 C) (Oral)   Ht '5\' 9"'  (1.753 m)   Wt 229 lb (103.9 kg)   BMI 33.82 kg/m   Physical Examination:  General: Awake, alert, overweight, No acute distress GU: No suprapubic tenderness to palpation.  No CVA tenderness to palpation.  Assessment/ Plan: 52 y.o. male   1. Sensation of pressure in bladder area Urinalysis was negative for evidence of urinary tract infection.  I have sent this off for culture in case this is a false negative.  I suspect that sensation may be secondary to bladder spasm versus BPH symptoms.  Patient was somewhat stoic during today's exam and seemed reluctant to delve into the genitourinary symptoms with me.  He is agreeable to blood labs.  Will check a PSA, CMP.  Will contact patient with the results of this.  Could consider Cialis for treatment of BPH and ED.  We will hold off on this until labs return. - Urinalysis - Urine Culture - CMP14+EGFR; Future -  PSA; Future  2. Screening for malignant neoplasm of colon Patient actually recommended to have colonoscopy by his PCP but he notes reluctance to have this done.  He was given a stool card today.  He will return with this when he comes in for labs next week. - Fecal occult blood, imunochemical; Future   Orders Placed This Encounter  Procedures  . Urine Culture  . Fecal occult blood, imunochemical    Standing Status:   Future    Standing Expiration Date:   04/10/2017  . Urinalysis  . CMP14+EGFR    Standing Status:   Future    Standing Expiration Date:   02/10/2018  . PSA    Standing Status:   Future    Standing Expiration Date:   02/10/2018      Harold Norlander,  DO [place] [phone]

## 2017-02-10 NOTE — Patient Instructions (Addendum)
Her urinalysis today was negative for evidence of urinary tract infection.  I have sent this for culture just to make sure that there is no bacterial growth.  I do have a suspicion that the symptoms are related to your prostate.  I have put in labs for you to come in and have done sometime next week at your convenience.  You do not need an appointment.  We can consider starting you on medications once these labs are done.   Benign Prostatic Hyperplasia Benign prostatic hyperplasia (BPH) is an enlarged prostate gland that is caused by the normal aging process and not by cancer. The prostate is a walnut-sized gland that is involved in the production of semen. It is located in front of the rectum and below the bladder. The bladder stores urine and the urethra is the tube that carries the urine out of the body. The prostate may get bigger as a man gets older. An enlarged prostate can press on the urethra. This can make it harder to pass urine. The build-up of urine in the bladder can cause infection. Back pressure and infection may progress to bladder damage and kidney (renal) failure. What are the causes? This condition is part of a normal aging process. However, not all men develop problems from this condition. If the prostate enlarges away from the urethra, urine flow will not be blocked. If it enlarges toward the urethra and compresses it, there will be problems passing urine. What increases the risk? This condition is more likely to develop in men over the age of 50 years. What are the signs or symptoms? Symptoms of this condition include:  Getting up often during the night to urinate.  Needing to urinate frequently during the day.  Difficulty starting urine flow.  Decrease in size and strength of your urine stream.  Leaking (dribbling) after urinating.  Inability to pass urine. This needs immediate treatment.  Inability to completely empty your bladder.  Pain when you pass urine. This  is more common if there is also an infection.  Urinary tract infection (UTI).  How is this diagnosed? This condition is diagnosed based on your medical history, a physical exam, and your symptoms. Tests will also be done, such as:  A post-void bladder scan. This measures any amount of urine that may remain in your bladder after you finish urinating.  A digital rectal exam. In a rectal exam, your health care provider checks your prostate by putting a lubricated, gloved finger into your rectum to feel the back of your prostate gland. This exam detects the size of your gland and any abnormal lumps or growths.  An exam of your urine (urinalysis).  A prostate specific antigen (PSA) screening. This is a blood test used to screen for prostate cancer.  An ultrasound. This test uses sound waves to electronically produce a picture of your prostate gland.  Your health care provider may refer you to a specialist in kidney and prostate diseases (urologist). How is this treated? Once symptoms begin, your health care provider will monitor your condition (active surveillance or watchful waiting). Treatment for this condition will depend on the severity of your condition. Treatment may include:  Observation and yearly exams. This may be the only treatment needed if your condition and symptoms are mild.  Medicines to relieve your symptoms, including: ? Medicines to shrink the prostate. ? Medicines to relax the muscle of the prostate.  Surgery in severe cases. Surgery may include: ? Prostatectomy. In this procedure,  the prostate tissue is removed completely through an open incision or with a laparascope or robotics. ? Transurethral resection of the prostate (TURP). In this procedure, a tool is inserted through the opening at the tip of the penis (urethra). It is used to cut away tissue of the inner core of the prostate. The pieces are removed through the same opening of the penis. This removes the  blockage. ? Transurethral incision (TUIP). In this procedure, small cuts are made in the prostate. This lessens the prostate's pressure on the urethra. ? Transurethral microwave thermotherapy (TUMT). This procedure uses microwaves to create heat. The heat destroys and removes a small amount of prostate tissue. ? Transurethral needle ablation (TUNA). This procedure uses radio frequencies to destroy and remove a small amount of prostate tissue. ? Interstitial laser coagulation (ILC). This procedure uses a laser to destroy and remove a small amount of prostate tissue. ? Transurethral electrovaporization (TUVP). This procedure uses electrodes to destroy and remove a small amount of prostate tissue. ? Prostatic urethral lift. This procedure inserts an implant to push the lobes of the prostate away from the urethra.  Follow these instructions at home:  Take over-the-counter and prescription medicines only as told by your health care provider.  Monitor your symptoms for any changes. Contact your health care provider with any changes.  Avoid drinking large amounts of liquid before going to bed or out in public.  Avoid or reduce how much caffeine or alcohol you drink.  Give yourself time when you urinate.  Keep all follow-up visits as told by your health care provider. This is important. Contact a health care provider if:  You have unexplained back pain.  Your symptoms do not get better with treatment.  You develop side effects from the medicine you are taking.  Your urine becomes very dark or has a bad smell.  Your lower abdomen becomes distended and you have trouble passing your urine. Get help right away if:  You have a fever or chills.  You suddenly cannot urinate.  You feel lightheaded, or very dizzy, or you faint.  There are large amounts of blood or clots in the urine.  Your urinary problems become hard to manage.  You develop moderate to severe low back or flank pain. The  flank is the side of your body between the ribs and the hip. These symptoms may represent a serious problem that is an emergency. Do not wait to see if the symptoms will go away. Get medical help right away. Call your local emergency services (911 in the [place] Do not drive yourself to the hospital. Summary  Benign prostatic hyperplasia (BPH) is an enlarged prostate that is caused by the normal aging process and not by cancer.  An enlarged prostate can press on the urethra. This can make it hard to pass urine.  This condition is part of a normal aging process and is more likely to develop in men over the age of 50 years.  Get help right away if you suddenly cannot urinate. This information is not intended to replace advice given to you by your health care provider. Make sure you discuss any questions you have with your health care provider. Document Released: 01/24/2005 Document Revised: 02/29/2016 Document Reviewed: 02/29/2016 Elsevier Interactive Patient Education  2018 [employer].  Erectile Dysfunction Erectile dysfunction (ED) is the inability to get or keep an erection in order to have sexual intercourse. Erectile dysfunction may include:  Inability to get an erection.  Lack  of enough hardness of the erection to allow penetration.  Loss of the erection before sex is finished.  What are the causes? This condition may be caused by:  Certain medicines, such as: ? Pain relievers. ? Antihistamines. ? Antidepressants. ? Blood pressure medicines. ? Water pills (diuretics). ? Ulcer medicines. ? Muscle relaxants. ? Drugs.  Excessive drinking.  Psychological causes, such as: ? Anxiety. ? Depression. ? Sadness. ? Exhaustion. ? Performance fear. ? Stress.  Physical causes, such as: ? Artery problems. This may include diabetes, smoking, liver disease, or atherosclerosis. ? High blood pressure. ? Hormonal problems, such as low testosterone. ? Obesity. ? Nerve problems.  This may include back or pelvic injuries, diabetes mellitus, multiple sclerosis, or Parkinson disease.  What are the signs or symptoms? Symptoms of this condition include:  Inability to get an erection.  Lack of enough hardness of the erection to allow penetration.  Loss of the erection before sex is finished.  Normal erections at some times, but with frequent unsatisfactory episodes.  Low sexual satisfaction in either partner due to erection problems.  A curved penis occurring with erection. The curve may cause pain or the penis may be too curved to allow for intercourse.  Never having nighttime erections.  How is this diagnosed? This condition is often diagnosed by:  Performing a physical exam to find other diseases or specific problems with the penis.  Asking you detailed questions about the problem.  Performing blood tests to check for diabetes mellitus or to measure hormone levels.  Performing other tests to check for underlying health conditions.  Performing an ultrasound exam to check for scarring.  Performing a test to check blood flow to the penis.  Doing a sleep study at home to measure nighttime erections.  How is this treated? This condition may be treated by:  Medicine taken by mouth to help you achieve an erection (oral medicine).  Hormone replacement therapy to replace low testosterone levels.  Medicine that is injected into the penis. Your health care provider may instruct you how to give yourself these injections at home.  Vacuum pump. This is a pump with a ring on it. The pump and ring are placed on the penis and used to create pressure that helps the penis become erect.  Penile implant surgery. In this procedure, you may receive: ? An inflatable implant. This consists of cylinders, a pump, and a reservoir. The cylinders can be inflated with a fluid that helps to create an erection, and they can be deflated after intercourse. ? A semi-rigid implant.  This consists of two silicone rubber rods. The rods provide some rigidity. They are also flexible, so the penis can both curve downward in its normal position and become straight for sexual intercourse.  Blood vessel surgery, to improve blood flow to the penis. During this procedure, a blood vessel from a different part of the body is placed into the penis to allow blood to flow around (bypass) damaged or blocked blood vessels.  Lifestyle changes, such as exercising more, losing weight, and quitting smoking.  Follow these instructions at home: Medicines  Take over-the-counter and prescription medicines only as told by your health care provider. Do not increase the dosage without first discussing it with your health care provider.  If you are using self-injections, perform injections as directed by your health care provider. Make sure to avoid any veins that are on the surface of the penis. After giving an injection, apply pressure to the injection site  for 5 minutes. General instructions  Exercise regularly, as directed by your health care provider. Work with your health care provider to lose weight, if needed.  Do not use any products that contain nicotine or tobacco, such as cigarettes and e-cigarettes. If you need help quitting, ask your health care provider.  Before using a vacuum pump, read the instructions that come with the pump and discuss any questions with your health care provider.  Keep all follow-up visits as told by your health care provider. This is important. Contact a health care provider if:  You feel nauseous.  You vomit. Get help right away if:  You are taking oral or injectable medicines and you have an erection that lasts longer than 4 hours. If your health care provider is unavailable, go to the nearest emergency room for evaluation. An erection that lasts much longer than 4 hours can result in permanent damage to your penis.  You have severe pain in your groin or  abdomen.  You develop redness or severe swelling of your penis.  You have redness spreading up into your groin or lower abdomen.  You are unable to urinate.  You experience chest pain or a rapid heart beat (palpitations) after taking oral medicines. Summary  Erectile dysfunction (ED) is the inability to get or keep an erection during sexual intercourse. This problem can usually be treated successfully.  This condition is diagnosed based on a physical exam, your symptoms, and tests to determine the cause. Treatment varies depending on the cause, and may include medicines, hormone therapy, surgery, or vacuum pump.  You may need follow-up visits to make sure that you are using your medicines or devices correctly.  Get help right away if you are taking or injecting medicines and you have an erection that lasts longer than 4 hours. This information is not intended to replace advice given to you by your health care provider. Make sure you discuss any questions you have with your health care provider. Document Released: 01/22/2000 Document Revised: 02/10/2016 Document Reviewed: 02/10/2016 Elsevier Interactive Patient Education  2017 [employer].

## 2017-02-12 LAB — URINE CULTURE

## 2017-02-13 ENCOUNTER — Other Ambulatory Visit: Payer: Managed Care, Other (non HMO)

## 2017-02-13 DIAGNOSIS — Z1211 Encounter for screening for malignant neoplasm of colon: Secondary | ICD-10-CM

## 2017-02-13 DIAGNOSIS — R3989 Other symptoms and signs involving the genitourinary system: Secondary | ICD-10-CM

## 2017-02-14 LAB — PSA: PROSTATE SPECIFIC AG, SERUM: 0.5 ng/mL (ref 0.0–4.0)

## 2017-02-14 LAB — CMP14+EGFR
A/G RATIO: 1.8 (ref 1.2–2.2)
ALT: 37 IU/L (ref 0–44)
AST: 26 IU/L (ref 0–40)
Albumin: 4.9 g/dL (ref 3.5–5.5)
Alkaline Phosphatase: 98 IU/L (ref 39–117)
BUN/Creatinine Ratio: 14 (ref 9–20)
BUN: 15 mg/dL (ref 6–24)
Bilirubin Total: 0.2 mg/dL (ref 0.0–1.2)
CALCIUM: 10.1 mg/dL (ref 8.7–10.2)
CHLORIDE: 102 mmol/L (ref 96–106)
CO2: 22 mmol/L (ref 20–29)
Creatinine, Ser: 1.11 mg/dL (ref 0.76–1.27)
GFR, EST AFRICAN AMERICAN: 87 mL/min/{1.73_m2} (ref >59)
GFR, EST NON AFRICAN AMERICAN: 75 mL/min/{1.73_m2} (ref >59)
GLUCOSE: 124 mg/dL — AB (ref 65–99)
Globulin, Total: 2.7 g/dL (ref 1.5–4.5)
POTASSIUM: 4.4 mmol/L (ref 3.5–5.2)
Sodium: 140 mmol/L (ref 134–144)
TOTAL PROTEIN: 7.6 g/dL (ref 6.0–8.5)

## 2017-02-15 LAB — FECAL OCCULT BLOOD, IMMUNOCHEMICAL: FECAL OCCULT BLD: NEGATIVE

## 2018-02-21 ENCOUNTER — Encounter: Payer: Self-pay | Admitting: Family Medicine

## 2018-02-21 ENCOUNTER — Ambulatory Visit (INDEPENDENT_AMBULATORY_CARE_PROVIDER_SITE_OTHER): Payer: 59 | Admitting: Family Medicine

## 2018-02-21 VITALS — BP 157/80 | HR 103 | Temp 97.8°F | Ht 69.0 in | Wt 215.0 lb

## 2018-02-21 DIAGNOSIS — M25562 Pain in left knee: Secondary | ICD-10-CM | POA: Diagnosis not present

## 2018-02-21 DIAGNOSIS — G8929 Other chronic pain: Secondary | ICD-10-CM | POA: Insufficient documentation

## 2018-02-21 MED ORDER — METHYLPREDNISOLONE ACETATE 80 MG/ML IJ SUSP: 80.0000 mg | Freq: Once | INTRAMUSCULAR | Status: DC

## 2018-02-21 MED ORDER — METHYLPREDNISOLONE ACETATE 80 MG/ML IJ SUSP
40.0000 mg | Freq: Once | INTRAMUSCULAR | Status: AC
  Administered 2018-02-21: 40 mg via INTRA_ARTICULAR

## 2018-02-21 NOTE — Addendum Note (Signed)
Addended byDory Peru on: 02/21/2018 02:26 PM   Modules accepted: Orders

## 2018-02-21 NOTE — Progress Notes (Signed)
Subjective: CC:  Chronic knee pain PCP: Harold IpGottschalk, Shay Bartoli M, DO UJW:JXBJHPI:Harold Bullock is a 53 y.o. male presenting to clinic today for:  1. Chronic knee pain Patient with greater than 2-year history of left-sided chronic knee pain.  He points to the medial aspect of the knee as a source of pain.  Pain is constant and seems to be worsening lately.  In 2018, he received a corticosteroid injection to the left knee which did seem to alleviate pain and keep it at bay for quite some time.  Patient notes that over the last month he has been working on a project which has put him on his knees quite a bit.  This seems to be the inciting factor this go around.  He is used Aleve and Biofreeze for the symptoms but has not had substantial relief.  Denies any sensation changes, sensation of instability or weakness in the left lower extremity.  No gross swelling or discoloration.   ROS: Per HPI  No Known Allergies Past Medical History:  Diagnosis Date  . OSA (obstructive sleep apnea) 05/21/2015  . Sleep apnea    No current outpatient medications on file. Social History   Socioeconomic History  . Marital status: Married    Spouse name: Not on file  . Number of children: Not on file  . Years of education: Not on file  . Highest education level: Not on file  Occupational History  . Occupation: Truck Runner, broadcasting/film/videoDriver  Social Needs  . Financial resource strain: Not on file  . Food insecurity:    Worry: Not on file    Inability: Not on file  . Transportation needs:    Medical: Not on file    Non-medical: Not on file  Tobacco Use  . Smoking status: Former Smoker    Packs/day: 1.00    Years: 20.00    Pack years: 20.00    Types: Cigarettes    Last attempt to quit: 05/27/2016    Years since quitting: 1.7  . Smokeless tobacco: Never Used  . Tobacco comment: reports "off and on"  Substance and Sexual Activity  . Alcohol use: Yes    Alcohol/week: 0.0 standard drinks    Comment: 6 beers/daily  . Drug  use: No  . Sexual activity: Not on file  Lifestyle  . Physical activity:    Days per week: Not on file    Minutes per session: Not on file  . Stress: Not on file  Relationships  . Social connections:    Talks on phone: Not on file    Gets together: Not on file    Attends religious service: Not on file    Active member of club or organization: Not on file    Attends meetings of clubs or organizations: Not on file    Relationship status: Not on file  . Intimate partner violence:    Fear of current or ex partner: Not on file    Emotionally abused: Not on file    Physically abused: Not on file    Forced sexual activity: Not on file  Other Topics Concern  . Not on file  Social History Narrative  . Not on file   Family History  Problem Relation Age of Onset  . Alzheimer's disease Mother   . Cancer Maternal Grandfather   . Cancer Maternal Uncle   . Cancer Paternal Grandmother     Objective: Office vital signs reviewed. BP (!) 157/80   Pulse (!) 103  Temp 97.8 F (36.6 C) (Oral)   Ht 5\' 9"  (1.753 m)   Wt 215 lb (97.5 kg)   BMI 31.75 kg/m   Physical Examination:  General: Awake, alert, well nourished, No acute distress Extremities: warm, well perfused, No edema, cyanosis or clubbing; +2 pulses bilaterally MSK: normal gait and station  Left knee: No gross swelling or discoloration of the left knee.  No increased warmth.  No tenderness to palpation to patella, patellar tendon or quad tendon.  He has mild tenderness to palpation along the medial joint line.  No posterior popliteal masses or tenderness to the posterior popliteal fossa.  No ligamentous laxity. Neuro: light touch sensation in tact  JOINT INJECTION:  Patient denies allergy to antiseptics (including iodine) and anesthetics.  Patient denies h/o diabetes, frequent steroid use, use of blood thinners/ antiplatelets.  Patient was given informed consent and a signed copy has been placed in the chart. Appropriate time  out was taken. Area prepped and draped in usual sterile fashion. Anatomic landmarks were identified and injection site was marked.  Ethyl chloride spray was used to numb the area and 1 cc of methylprednisolone 40 mg/ml plus  3 cc of 1% lidocaine without epinephrine was injected into the left knee using a(n) anteriorlateral approach. The patient tolerated the procedure well and there were no immediate complications. Estimated blood loss is less than 1 cc.  Post procedure instructions were reviewed with the patient.   Assessment/ Plan: 53 y.o. male   1. Chronic pain of left knee Longstanding issue for patient.  He is requesting joint injection today since symptoms are refractory to OTC methods.  Corticosteroid injection was administered and patient tolerated procedure well.  There is no immediate complications.  Postprocedure instructions reviewed with the patient and he will return PRN.   Harold Ip, DO [facility] [phone]

## 2019-06-10 ENCOUNTER — Telehealth (INDEPENDENT_AMBULATORY_CARE_PROVIDER_SITE_OTHER): Payer: 59 | Admitting: Family Medicine

## 2019-06-10 ENCOUNTER — Encounter: Payer: Self-pay | Admitting: Family Medicine

## 2019-06-10 DIAGNOSIS — L03211 Cellulitis of face: Secondary | ICD-10-CM | POA: Diagnosis not present

## 2019-06-10 MED ORDER — SULFAMETHOXAZOLE-TRIMETHOPRIM 800-160 MG PO TABS: 1.0000 | ORAL_TABLET | Freq: Two times a day (BID) | ORAL | 0 refills | Status: DC

## 2019-06-10 NOTE — Progress Notes (Signed)
   Virtual Visit via telephone Note  I connected with Harold Bullock on 06/10/19 at 1138 by telephone and verified that I am speaking with the correct person using two identifiers. [name] is currently located at car and no other people are currently with her during visit. The provider, Elige Radon Deena Shaub, MD is located in their office at time of visit.  Call ended at 1145  I discussed the limitations, risks, security and privacy concerns of performing an evaluation and management service by telephone and the availability of in person appointments. I also discussed with the patient that there may be a patient responsible charge related to this service. The patient expressed understanding and agreed to proceed.   History and Present Illness: Patient started having a rash on both sides of his nose that started like a pimple and was itchy and then has spread. No pain.  It has been red and when he takes a shower the scab will come off and then ooze clear and then worsen.  He does use a CPAP machine.   No diagnosis found.  No outpatient encounter medications on file as of 06/10/2019.   No facility-administered encounter medications on file as of 06/10/2019.    Review of Systems  Constitutional: Negative for chills and fever.  Respiratory: Negative for shortness of breath and wheezing.   Cardiovascular: Negative for chest pain and leg swelling.  Musculoskeletal: Negative for back pain and gait problem.  Skin: Positive for color change and rash.  All other systems reviewed and are negative.   Observations/Objective: Patient has 2 spots on either side of his nose with erythema and swelling that starting to spread up his nose, it is hard to tell on the video but it looks like it may have some yellow crusting on the right side spot and they both look like there about a half a centimeter in diameter  Assessment and Plan: Problem List Items Addressed This Visit    None    Visit  Diagnoses    Cellulitis of face    -  Primary   Relevant Medications   sulfamethoxazole-trimethoprim (BACTRIM DS) 800-160 MG tablet      Looks like cellulitis on exam on the video, will treat with Bactrim, if worsens return.  Follow up plan: Return if symptoms worsen or fail to improve.     I discussed the assessment and treatment plan with the patient. The patient was provided an opportunity to ask questions and all were answered. The patient agreed with the plan and demonstrated an understanding of the instructions.   The patient was advised to call back or seek an in-person evaluation if the symptoms worsen or if the condition fails to improve as anticipated.  The above assessment and management plan was discussed with the patient. The patient verbalized understanding of and has agreed to the management plan. Patient is aware to call the clinic if symptoms persist or worsen. Patient is aware when to return to the clinic for a follow-up visit. Patient educated on when it is appropriate to go to the emergency department.    I provided 7 minutes of non-face-to-face time during this encounter.    Nils Pyle, MD

## 2019-06-21 ENCOUNTER — Telehealth: Payer: Self-pay | Admitting: Family Medicine

## 2019-06-21 ENCOUNTER — Telehealth: Payer: Self-pay | Admitting: Pulmonary Disease

## 2019-06-21 NOTE — Telephone Encounter (Signed)
At this point patient needs to be seen in person, I would have him go ahead and come in person, he does not have any other Covid symptoms.

## 2019-06-21 NOTE — Telephone Encounter (Signed)
Pt has not been seen at office since 12/16/16. Pt needs an appt. Called and spoke with pt and stated to him that we would need to schedule an appt to further eval and he verbalized understanding. Pt has been scheduled with TP 5/17 at 11 am and pt has been made aware of our office address. Nothing further needed.

## 2019-06-21 NOTE — Telephone Encounter (Signed)
Left detailed message to call office so we can schedule an appt for Monday   Patient called back and appt scheduled

## 2019-06-21 NOTE — Telephone Encounter (Signed)
What else to do about rash?

## 2019-06-24 ENCOUNTER — Ambulatory Visit: Payer: 59 | Admitting: Nurse Practitioner

## 2019-06-24 ENCOUNTER — Ambulatory Visit (INDEPENDENT_AMBULATORY_CARE_PROVIDER_SITE_OTHER): Payer: 59 | Admitting: Adult Health

## 2019-06-24 ENCOUNTER — Encounter: Payer: Self-pay | Admitting: Adult Health

## 2019-06-24 ENCOUNTER — Other Ambulatory Visit: Payer: Self-pay

## 2019-06-24 ENCOUNTER — Encounter: Payer: Self-pay | Admitting: Nurse Practitioner

## 2019-06-24 VITALS — BP 140/78 | HR 88 | Temp 98.3°F | Ht 69.0 in | Wt 219.6 lb

## 2019-06-24 DIAGNOSIS — E669 Obesity, unspecified: Secondary | ICD-10-CM | POA: Insufficient documentation

## 2019-06-24 DIAGNOSIS — I10 Essential (primary) hypertension: Secondary | ICD-10-CM | POA: Insufficient documentation

## 2019-06-24 DIAGNOSIS — G4733 Obstructive sleep apnea (adult) (pediatric): Secondary | ICD-10-CM | POA: Diagnosis not present

## 2019-06-24 DIAGNOSIS — R21 Rash and other nonspecific skin eruption: Secondary | ICD-10-CM | POA: Insufficient documentation

## 2019-06-24 MED ORDER — CLINDAMYCIN PHOSPHATE 1 % EX SOLN: Freq: Two times a day (BID) | CUTANEOUS | 0 refills | Status: DC

## 2019-06-24 MED ORDER — LISINOPRIL 5 MG PO TABS: 5.0000 mg | ORAL_TABLET | Freq: Every day | ORAL | 3 refills | Status: DC

## 2019-06-24 MED ORDER — PREDNISONE 10 MG (21) PO TBPK: ORAL_TABLET | ORAL | 0 refills | Status: DC

## 2019-06-24 NOTE — Assessment & Plan Note (Signed)
-   Weight loss 

## 2019-06-24 NOTE — Patient Instructions (Addendum)
Continue on CPAP At bedtime   Keep up great job Work on weight loss.  Do not drive if sleepy  Can try the Dream Wear Nasal mask .  Follow up with Primary MD today for high blood pressure.  Follow up Dr. Craige Cotta  In 1 year and As needed

## 2019-06-24 NOTE — Progress Notes (Signed)
Acute Office Visit  Subjective:    Patient ID: Harold Bullock, male    DOB: 02-Oct-1965, 54 y.o.   MRN: [id number]  Chief Complaint  Patient presents with  . Rash    Patient states that he has had a rash on his nose x 1 month    HPI Patient is in today for follow up of facial rash, rash has been on patients nose for about 4 weeks, he reports using a CPAP at night but rinses out and sanitizes equipment after each use. This is a new problem for patient. Skin is dry and itchy. Redness around affected skin. No pain or burning is reported. Patient has used prescription antibiotic for 10 days that provided moderate relief. Triple antibiotic ointment and cold compress.  Concerning Hypertension, Patient was diagnosed a few years ago. The patient is not taking any medication, He stopped smoking and started exercising and his blood pressure stabilized. patient said in the last year, he was unable to exercise, increase stress due to family and work, caused him to start smoking again. Blood pressures are elevated 185/86 in the AM and 140/78. Patient says he does not  have the time or energy to exercise and has failed several times trying to quit smoking ans will rather have a pill.      Past Medical History:  Diagnosis Date  . OSA (obstructive sleep apnea) 05/21/2015  . Sleep apnea     Past Surgical History:  Procedure Laterality Date  . BACK SURGERY  2005     Family History  Problem Relation Age of Onset  . Alzheimer's disease Mother   . Cancer Maternal Grandfather   . Cancer Maternal Uncle   . Cancer Paternal Grandmother     Social History   Socioeconomic History  . Marital status: Married    Spouse name: Not on file  . Number of children: Not on file  . Years of education: Not on file  . Highest education level: Not on file  Occupational History  . Occupation: [employer] doctor  Tobacco Use  . Smoking status: Current Every Day Smoker    Packs/day: 1.00    Years: 20.00    Pack  years: 20.00    Types: Cigarettes    Last attempt to quit: 05/27/2016    Years since quitting: 3.0  . Smokeless tobacco: Never Used  Substance and Sexual Activity  . Alcohol use: Yes    Alcohol/week: 0.0 standard drinks    Comment: 6 beers/daily  . Drug use: No  . Sexual activity: Not on file  Other Topics Concern  . Not on file  Social History Narrative  . Not on file   Social Determinants of Health   Financial Resource Strain:   . Difficulty of Paying Living Expenses:   Food Insecurity:   . Worried About Programme researcher, broadcasting/film/video in the Last Year:   . Barista in the Last Year:   Transportation Needs:   . Freight forwarder (Medical):   Marland Kitchen Lack of Transportation (Non-Medical):   Physical Activity:   . Days of Exercise per Week:   . Minutes of Exercise per Session:   Stress:   . Feeling of Stress :   Social Connections:   . Frequency of Communication with Friends and Family:   . Frequency of Social Gatherings with Friends and Family:   . Attends Religious Services:   . Active Member of Clubs or Organizations:   . Attends Club or  Organization Meetings:   Marland Kitchen Marital Status:   Intimate Partner Violence:   . Fear of Current or Ex-Partner:   . Emotionally Abused:   Marland Kitchen Physically Abused:   . Sexually Abused:     No outpatient medications prior to visit.   No facility-administered medications prior to visit.    No Known Allergies  Review of Systems  Constitutional: Negative for activity change, appetite change, fatigue and unexpected weight change.  HENT: Negative.   Respiratory: Negative for cough, chest tightness and shortness of breath.   Cardiovascular: Negative for chest pain, palpitations and leg swelling.  Gastrointestinal: Negative for abdominal distention and abdominal pain.  Endocrine: Negative.   Genitourinary: Negative for difficulty urinating.  Musculoskeletal: Negative for arthralgias and myalgias.  Skin: Positive for color change and rash.   Neurological: Negative for facial asymmetry.  Psychiatric/Behavioral: The patient is not nervous/anxious.        Objective:    Physical Exam Constitutional:      Appearance: Normal appearance.  HENT:     Head: Normocephalic.  Eyes:     Conjunctiva/sclera: Conjunctivae normal.  Cardiovascular:     Rate and Rhythm: Normal rate and regular rhythm.     Pulses: Normal pulses.     Heart sounds: Normal heart sounds.  Pulmonary:     Effort: Pulmonary effort is normal.     Breath sounds: Normal breath sounds.  Abdominal:     General: Bowel sounds are normal.     Tenderness: There is no abdominal tenderness.  Musculoskeletal:        General: No tenderness.     Cervical back: Neck supple.  Skin:    General: Skin is warm.     Findings: Erythema and rash present.  Neurological:     Mental Status: He is alert and oriented to person, place, and time.  Psychiatric:        Mood and Affect: Mood normal.        Behavior: Behavior normal.     BP 140/78   Pulse 88   Temp 98.3 F (36.8 C) (Temporal)   Ht 5\' 9"  (1.753 m)   Wt 219 lb 9.6 oz (99.6 kg)   SpO2 98%   BMI 32.43 kg/m  Wt Readings from Last 3 Encounters:  06/24/19 219 lb 9.6 oz (99.6 kg)  06/24/19 220 lb (99.8 kg)  02/21/18 215 lb (97.5 kg)    Health Maintenance Due  Topic Date Due  . HIV Screening  Never done  . COVID-19 Vaccine (1) Never done  . COLONOSCOPY  Never done  . COLON CANCER SCREENING ANNUAL FOBT  02/13/2018    There are no preventive care reminders to display for this patient.   No results found for: TSH Lab Results  Component Value Date   WBC 5.0 01/05/2015   HGB 14.3 01/05/2015   HCT 42.4 01/05/2015   MCV 96 01/05/2015   PLT 230 01/05/2015   Lab Results  Component Value Date   NA 140 02/13/2017   K 4.4 02/13/2017   CO2 22 02/13/2017   GLUCOSE 124 (H) 02/13/2017   BUN 15 02/13/2017   CREATININE 1.11 02/13/2017   BILITOT 0.2 02/13/2017   ALKPHOS 98 02/13/2017   AST 26 02/13/2017    ALT 37 02/13/2017   PROT 7.6 02/13/2017   ALBUMIN 4.9 02/13/2017   CALCIUM 10.1 02/13/2017   Lab Results  Component Value Date   CHOL 232 (H) 01/05/2015   Lab Results  Component Value Date  HDL 45 01/05/2015   Lab Results  Component Value Date   LDLCALC 144 (H) 01/05/2015   Lab Results  Component Value Date   TRIG 214 (H) 01/05/2015   Lab Results  Component Value Date   CHOLHDL 5.2 (H) 01/05/2015        Assessment & Plan:   Problem List Items Addressed This Visit      Cardiovascular and Mediastinum   Hypertension    Hypertension not well controlled, education provided to patient , we talked about diet and exeresis, life style modification, smoking cessation and stress reduction. Patient started on a ow dose lisinopril 5 mg. Patient will keep a blood pressure log, and follow up in 2 weeks.  Rx sent to pharmacy      Relevant Medications   lisinopril (ZESTRIL) 5 MG tablet     Musculoskeletal and Integument   Rash of face - Primary    Facial rash is moderately controlled, face looks better than first visit but not completely resolved, topical clindamycin started, and a prednisone pack. Education given to patient on keeping hands clean before touching face, keeping CPAP machine cleaned and air dry before every use. Patient knows to call as needed with unresolved or worsening symptoms.  RX sent to pharmacy.      Relevant Medications   clindamycin (CLEOCIN T) 1 % external solution   predniSONE (STERAPRED UNI-PAK 21 TAB) 10 MG (21) TBPK tablet       Meds ordered this encounter  Medications  . clindamycin (CLEOCIN T) 1 % external solution    Sig: Apply topically 2 (two) times daily.    Dispense:  30 mL    Refill:  0    Order Specific Question:   Supervising Provider    Answer:   Arville Care A [id number]  . lisinopril (ZESTRIL) 5 MG tablet    Sig: Take 1 tablet (5 mg total) by mouth daily.    Dispense:  90 tablet    Refill:  3    Order Specific  Question:   Supervising Provider    Answer:   Arville Care A [id number]  . predniSONE (STERAPRED UNI-PAK 21 TAB) 10 MG (21) TBPK tablet    Sig: 60 mg po day 1, 50 mg day 2, 40 mg day 3, 30 mg day 4, 20 mg  day 5, 10 mg day 6    Dispense:  1 each    Refill:  0    Order Specific Question:   Supervising Provider    Answer:   Arville Care A [[id number]]     Daryll Drown, NP

## 2019-06-24 NOTE — Assessment & Plan Note (Signed)
Excellent control on CPAP. Mild skin irritation from nasal mask.   Discussed different mask options including DreamWear nasal mask. Patient will contact DME company to choose alternative mask   Plan  Patient Instructions  Continue on CPAP At bedtime   Keep up great job Work on weight loss.  Do not drive if sleepy  Can try the Dream Wear Nasal mask .  Follow up with Primary MD today for high blood pressure.  Follow up Dr. Craige Cotta  In 1 year and As needed

## 2019-06-24 NOTE — Assessment & Plan Note (Signed)
The patient's blood pressure is elevated today.  Patient advised on a healthy diet.  Patient does have a follow-up appointment today with his primary care provider to discuss elevated blood pressure.

## 2019-06-24 NOTE — Assessment & Plan Note (Signed)
Hypertension not well controlled, education provided to patient , we talked about diet and exeresis, life style modification, smoking cessation and stress reduction. Patient started on a ow dose lisinopril 5 mg. Patient will keep a blood pressure log, and follow up in 2 weeks.  Rx sent to pharmacy

## 2019-06-24 NOTE — Assessment & Plan Note (Signed)
Facial rash is moderately controlled, face looks better than first visit but not completely resolved, topical clindamycin started, and a prednisone pack. Education given to patient on keeping hands clean before touching face, keeping CPAP machine cleaned and air dry before every use. Patient knows to call as needed with unresolved or worsening symptoms.  RX sent to pharmacy.

## 2019-06-24 NOTE — Progress Notes (Deleted)
Established Patient Office Visit  Subjective:  Patient ID: Harold Bullock, male    DOB: 05-30-1965  Age: 53 y.o. MRN: [id number]  CC:  Chief Complaint  Patient presents with  . Rash    Patient states that he has had a rash on his nose x 1 month    HPI Harold Bullock presents for ***  Hypertension:  Past Medical History:  Diagnosis Date  . OSA (obstructive sleep apnea) 05/21/2015  . Sleep apnea     Past Surgical History:  Procedure Laterality Date  . BACK SURGERY  2005     Family History  Problem Relation Age of Onset  . Alzheimer's disease Mother   . Cancer Maternal Grandfather   . Cancer Maternal Uncle   . Cancer Paternal Grandmother     Social History   Socioeconomic History  . Marital status: Married    Spouse name: Not on file  . Number of children: Not on file  . Years of education: Not on file  . Highest education level: Not on file  Occupational History  . Occupation: [employer] doctor  Tobacco Use  . Smoking status: Current Every Day Smoker    Packs/day: 1.00    Years: 20.00    Pack years: 20.00    Types: Cigarettes    Last attempt to quit: 05/27/2016    Years since quitting: 3.0  . Smokeless tobacco: Never Used  Substance and Sexual Activity  . Alcohol use: Yes    Alcohol/week: 0.0 standard drinks    Comment: 6 beers/daily  . Drug use: No  . Sexual activity: Not on file  Other Topics Concern  . Not on file  Social History Narrative  . Not on file   Social Determinants of Health   Financial Resource Strain:   . Difficulty of Paying Living Expenses:   Food Insecurity:   . Worried About Programme researcher, broadcasting/film/video in the Last Year:   . Barista in the Last Year:   Transportation Needs:   . Freight forwarder (Medical):   Marland Kitchen Lack of Transportation (Non-Medical):   Physical Activity:   . Days of Exercise per Week:   . Minutes of Exercise per Session:   Stress:   . Feeling of Stress :   Social Connections:   . Frequency of  Communication with Friends and Family:   . Frequency of Social Gatherings with Friends and Family:   . Attends Religious Services:   . Active Member of Clubs or Organizations:   . Attends Banker Meetings:   Marland Kitchen Marital Status:   Intimate Partner Violence:   . Fear of Current or Ex-Partner:   . Emotionally Abused:   Marland Kitchen Physically Abused:   . Sexually Abused:     No outpatient medications prior to visit.   No facility-administered medications prior to visit.    No Known Allergies  ROS Review of Systems    Objective:    Physical Exam  BP 140/78   Pulse 88   Temp 98.3 F (36.8 C) (Temporal)   Ht 5\' 9"  (1.753 m)   Wt 219 lb 9.6 oz (99.6 kg)   SpO2 98%   BMI 32.43 kg/m  Wt Readings from Last 3 Encounters:  06/24/19 219 lb 9.6 oz (99.6 kg)  06/24/19 220 lb (99.8 kg)  02/21/18 215 lb (97.5 kg)     Health Maintenance Due  Topic Date Due  . HIV Screening  Never done  . COVID-19  Vaccine (1) Never done  . COLONOSCOPY  Never done  . COLON CANCER SCREENING ANNUAL FOBT  02/13/2018    There are no preventive care reminders to display for this patient.  No results found for: TSH Lab Results  Component Value Date   WBC 5.0 01/05/2015   HGB 14.3 01/05/2015   HCT 42.4 01/05/2015   MCV 96 01/05/2015   PLT 230 01/05/2015   Lab Results  Component Value Date   NA 140 02/13/2017   K 4.4 02/13/2017   CO2 22 02/13/2017   GLUCOSE 124 (H) 02/13/2017   BUN 15 02/13/2017   CREATININE 1.11 02/13/2017   BILITOT 0.2 02/13/2017   ALKPHOS 98 02/13/2017   AST 26 02/13/2017   ALT 37 02/13/2017   PROT 7.6 02/13/2017   ALBUMIN 4.9 02/13/2017   CALCIUM 10.1 02/13/2017   Lab Results  Component Value Date   CHOL 232 (H) 01/05/2015   Lab Results  Component Value Date   HDL 45 01/05/2015   Lab Results  Component Value Date   LDLCALC 144 (H) 01/05/2015   Lab Results  Component Value Date   TRIG 214 (H) 01/05/2015   Lab Results  Component Value Date    CHOLHDL 5.2 (H) 01/05/2015   No results found for: HGBA1C    Assessment & Plan:   Problem List Items Addressed This Visit      Cardiovascular and Mediastinum   Hypertension   Relevant Medications   lisinopril (ZESTRIL) 5 MG tablet    Other Visit Diagnoses    Rash of face    -  Primary   Relevant Medications   clindamycin (CLEOCIN T) 1 % external solution      Meds ordered this encounter  Medications  . clindamycin (CLEOCIN T) 1 % external solution    Sig: Apply topically 2 (two) times daily.    Dispense:  30 mL    Refill:  0    Order Specific Question:   Supervising Provider    Answer:   Caryl Pina A [id number]  . lisinopril (ZESTRIL) 5 MG tablet    Sig: Take 1 tablet (5 mg total) by mouth daily.    Dispense:  90 tablet    Refill:  3    Order Specific Question:   Supervising Provider    Answer:   Caryl Pina A [[id number]]    Follow-up: No follow-ups on file.    Ivy Lynn, NP

## 2019-06-24 NOTE — Progress Notes (Signed)
@Patient  ID: Harold Bullock, male    DOB: Apr 14, 1965, 54 y.o.   MRN: [id number]  Chief Complaint  Patient presents with  . Follow-up    OSA     Referring provider: Janora Norlander, DO  HPI: 54 year old male followed for severe obstructive sleep apnea on nocturnal CPAP  TEST/EVENTS :  HST 05/20/15 >> AHI 67.9, SaO2 low 71% Auto CPAP 11/14/16 to 12/13/16 >> used on 30 of 30 nights with average 7 hrs 50 min.  Average AHI 1 with median CPAP 9 and 95 th percentile CPAP 11 cm H2O  06/24/2019 Follow up : OSA  Patient returns for a follow-up for sleep apnea.  Last seen November 2018.  Patient says he is doing well on CPAP wears it every single night.  However has irritation along his nose area from his CPAP mask. Patient says he feels rested and feels that he benefits from CPAP.  Download shows excellent compliance with daily average usage at 7 hours.  He has 100% compliance.  Patient is on auto CPAP 5 to 15 cm H2O.  AHI is 1.0.  Minimum leaks.   No Known Allergies  Immunization History  Administered Date(s) Administered  . Tdap 01/05/2015    Past Medical History:  Diagnosis Date  . OSA (obstructive sleep apnea) 05/21/2015  . Sleep apnea     Tobacco History: Social History   Tobacco Use  Smoking Status Current Every Day Smoker  . Packs/day: 1.00  . Years: 20.00  . Pack years: 20.00  . Types: Cigarettes  . Last attempt to quit: 05/27/2016  . Years since quitting: 3.0  Smokeless Tobacco Never Used   Ready to quit: Not Answered Counseling given: Not Answered   Outpatient Medications Prior to Visit  Medication Sig Dispense Refill  . sulfamethoxazole-trimethoprim (BACTRIM DS) 800-160 MG tablet Take 1 tablet by mouth 2 (two) times daily. 20 tablet 0   No facility-administered medications prior to visit.     Review of Systems:   Constitutional:   No  weight loss, night sweats,  Fevers, chills, fatigue, or  lassitude.  HEENT:   No headaches,  Difficulty swallowing,   Tooth/dental problems, or  Sore throat,                No sneezing, itching, ear ache, nasal congestion, post nasal drip,   CV:  No chest pain,  Orthopnea, PND, swelling in lower extremities, anasarca, dizziness, palpitations, syncope.   GI  No heartburn, indigestion, abdominal pain, nausea, vomiting, diarrhea, change in bowel habits, loss of appetite, bloody stools.   Resp: No shortness of breath with exertion or at rest.  No excess mucus, no productive cough,  No non-productive cough,  No coughing up of blood.  No change in color of mucus.  No wheezing.  No chest wall deformity  Skin: no rash or lesions.  GU: no dysuria, change in color of urine, no urgency or frequency.  No flank pain, no hematuria   MS:  No joint pain or swelling.  No decreased range of motion.  No back pain.    Physical Exam  BP (!) 182/88 (BP Location: Left Arm, Cuff Size: Normal)   Pulse 91   Ht 5\' 9"  (1.753 m)   Wt 220 lb (99.8 kg)   SpO2 98%   BMI 32.49 kg/m   GEN: A/Ox3; pleasant , NAD, well nourished    HEENT:  [place]/AT, , NOSE-clear, THROAT-clear, no lesions, no postnasal drip or exudate noted.  Mild  redness along the bridge of the nose, skin intact.  NECK:  Supple w/ fair ROM; no JVD; normal carotid impulses w/o bruits; no thyromegaly or nodules palpated; no lymphadenopathy.    RESP  Clear  P & A; w/o, wheezes/ rales/ or rhonchi. no accessory muscle use, no dullness to percussion  CARD:  RRR, no m/r/g, no peripheral edema, pulses intact, no cyanosis or clubbing.  GI:   Soft & nt; nml bowel sounds; no organomegaly or masses detected.   Musco: Warm bil, no deformities or joint swelling noted.   Neuro: alert, no focal deficits noted.    Skin: Warm, no lesions or rashes    Lab Results:  CBC  No results found for: BNP  ProBNP No results found for: PROBNP  Imaging: No results found.    No flowsheet data found.  No results found for: NITRICOXIDE      Assessment & Plan:   OSA  (obstructive sleep apnea) Excellent control on CPAP. Mild skin irritation from nasal mask.   Discussed different mask options including DreamWear nasal mask. Patient will contact DME company to choose alternative mask   Plan  Patient Instructions  Continue on CPAP At bedtime   Keep up great job Work on weight loss.  Do not drive if sleepy  Can try the Dream Wear Nasal mask .  Follow up with Primary MD today for high blood pressure.  Follow up Dr. Craige Cotta  In 1 year and As needed       Morbid obesity (HCC) Weight loss   Hypertension The patient's blood pressure is elevated today.  Patient advised on a healthy diet.  Patient does have a follow-up appointment today with his primary care provider to discuss elevated blood pressure.     Rubye Oaks, NP 06/24/2019

## 2019-06-24 NOTE — Patient Instructions (Addendum)
Hypertension not well controlled, education provided to patient , we talked about diet and exeresis, life style modification, smoking cessation and stress reduction. Patient started on a ow dose lisinopril 5 mg. Patient will keep a blood pressure log, and follow up in 2 weeks.  Rx sent to pharmacy  Facial rash is moderately controlled, face looks better than first visit but not completely resolved, topical clindamycin started, and a prednisone pack. Education given to patient on keeping hands clean before touching face, keeping CPAP machine cleaned and air dry before every use. Patient knows to call as needed with unresolved or worsening symptoms.  RX sent to pharmacy.  Rash, Adult  A rash is a change in the color of your skin. A rash can also change the way your skin feels. There are many different conditions and factors that can cause a rash. Follow these instructions at home: The goal of treatment is to stop the itching and keep the rash from spreading. Watch for any changes in your symptoms. Let your doctor know about them. Follow these instructions to help with your condition: Medicine Take or apply over-the-counter and prescription medicines only as told by your doctor. These may include medicines:  To treat red or swollen skin (corticosteroid creams).  To treat itching.  To treat an allergy (oral antihistamines).  To treat very bad symptoms (oral corticosteroids).  Skin care  Put cool cloths (compresses) on the affected areas.  Do not scratch or rub your skin.  Avoid covering the rash. Make sure that the rash is exposed to air as much as possible. Managing itching and discomfort  Avoid hot showers or baths. These can make itching worse. A cold shower may help.  Try taking a bath with: ? Epsom salts. You can get these at your local pharmacy or grocery store. Follow the instructions on the package. ? Baking soda. Pour a small amount into the bath as told by your  doctor. ? Colloidal oatmeal. You can get this at your local pharmacy or grocery store. Follow the instructions on the package.  Try putting baking soda paste onto your skin. Stir water into baking soda until it gets like a paste.  Try putting on a lotion that relieves itchiness (calamine lotion).  Keep cool and out of the sun. Sweating and being hot can make itching worse. General instructions   Rest as needed.  Drink enough fluid to keep your pee (urine) pale yellow.  Wear loose-fitting clothing.  Avoid scented soaps, detergents, and perfumes. Use gentle soaps, detergents, perfumes, and other cosmetic products.  Avoid anything that causes your rash. Keep a journal to help track what causes your rash. Write down: ? What you eat. ? What cosmetic products you use. ? What you drink. ? What you wear. This includes jewelry.  Keep all follow-up visits as told by your doctor. This is important. Contact a doctor if:  You sweat at night.  You lose weight.  You pee (urinate) more than normal.  You pee less than normal, or you notice that your pee is a darker color than normal.  You feel weak.  You throw up (vomit).  Your skin or the whites of your eyes look yellow (jaundice).  Your skin: ? Tingles. ? Is numb.  Your rash: ? Does not go away after a few days. ? Gets worse.  You are: ? More thirsty than normal. ? More tired than normal.  You have: ? New symptoms. ? Pain in your belly (abdomen). ?  A fever. ? Watery poop (diarrhea). Get help right away if:  You have a fever and your symptoms suddenly get worse.  You start to feel mixed up (confused).  You have a very bad headache or a stiff neck.  You have very bad joint pains or stiffness.  You have jerky movements that you cannot control (seizure).  Your rash covers all or most of your body. The rash may or may not be painful.  You have blisters that: ? Are on top of the rash. ? Grow larger. ? Grow  together. ? Are painful. ? Are inside your nose or mouth.  You have a rash that: ? Looks like purple pinprick-sized spots all over your body. ? Has a "bull's eye" or looks like a target. ? Is red and painful, causes your skin to peel, and is not from being in the sun too long. Summary  A rash is a change in the color of your skin. A rash can also change the way your skin feels.  The goal of treatment is to stop the itching and keep the rash from spreading.  Take or apply over-the-counter and prescription medicines only as told by your doctor.  Contact a doctor if you have new symptoms or symptoms that get worse.  Keep all follow-up visits as told by your doctor. This is important. This information is not intended to replace advice given to you by your health care provider. Make sure you discuss any questions you have with your health care provider. Document Revised: 05/18/2018 Document Reviewed: 08/28/2017 Elsevier Patient Education  2020 [employer]. Hypertension, Adult Hypertension is another name for high blood pressure. High blood pressure forces your heart to work harder to pump blood. This can cause problems over time. There are two numbers in a blood pressure reading. There is a top number (systolic) over a bottom number (diastolic). It is best to have a blood pressure that is below 120/80. Healthy choices can help lower your blood pressure, or you may need medicine to help lower it. What are the causes? The cause of this condition is not known. Some conditions may be related to high blood pressure. What increases the risk?  Smoking.  Having type 2 diabetes mellitus, high cholesterol, or both.  Not getting enough exercise or physical activity.  Being overweight.  Having too much fat, sugar, calories, or salt (sodium) in your diet.  Drinking too much alcohol.  Having long-term (chronic) kidney disease.  Having a family history of high blood pressure.  Age. Risk  increases with age.  Race. You may be at higher risk if you are African American.  Gender. Men are at higher risk than women before age 69. After age 15, women are at higher risk than men.  Having obstructive sleep apnea.  Stress. What are the signs or symptoms?  High blood pressure may not cause symptoms. Very high blood pressure (hypertensive crisis) may cause: ? Headache. ? Feelings of worry or nervousness (anxiety). ? Shortness of breath. ? Nosebleed. ? A feeling of being sick to your stomach (nausea). ? Throwing up (vomiting). ? Changes in how you see. ? Very bad chest pain. ? Seizures. How is this treated?  This condition is treated by making healthy lifestyle changes, such as: ? Eating healthy foods. ? Exercising more. ? Drinking less alcohol.  Your health care provider may prescribe medicine if lifestyle changes are not enough to get your blood pressure under control, and if: ? Your top number  is above 130. ? Your bottom number is above 80.  Your personal target blood pressure may vary. Follow these instructions at home: Eating and drinking   If told, follow the DASH eating plan. To follow this plan: ? Fill one half of your plate at each meal with fruits and vegetables. ? Fill one fourth of your plate at each meal with whole grains. Whole grains include whole-wheat pasta, brown rice, and whole-grain bread. ? Eat or drink low-fat dairy products, such as skim milk or low-fat yogurt. ? Fill one fourth of your plate at each meal with low-fat (lean) proteins. Low-fat proteins include fish, chicken without skin, eggs, beans, and tofu. ? Avoid fatty meat, cured and processed meat, or chicken with skin. ? Avoid pre-made or processed food.  Eat less than 1,500 mg of salt each day.  Do not drink alcohol if: ? Your doctor tells you not to drink. ? You are pregnant, may be pregnant, or are planning to become pregnant.  If you drink alcohol: ? Limit how much you use  to:  0-1 drink a day for women.  0-2 drinks a day for men. ? Be aware of how much alcohol is in your drink. In the [place] one drink equals one 12 oz bottle of beer (355 mL), one 5 oz glass of wine (148 mL), or one 1 oz glass of hard liquor (44 mL). Lifestyle   Work with your doctor to stay at a healthy weight or to lose weight. Ask your doctor what the best weight is for you.  Get at least 30 minutes of exercise most days of the week. This may include walking, swimming, or biking.  Get at least 30 minutes of exercise that strengthens your muscles (resistance exercise) at least 3 days a week. This may include lifting weights or doing Pilates.  Do not use any products that contain nicotine or tobacco, such as cigarettes, e-cigarettes, and chewing tobacco. If you need help quitting, ask your doctor.  Check your blood pressure at home as told by your doctor.  Keep all follow-up visits as told by your doctor. This is important. Medicines  Take over-the-counter and prescription medicines only as told by your doctor. Follow directions carefully.  Do not skip doses of blood pressure medicine. The medicine does not work as well if you skip doses. Skipping doses also puts you at risk for problems.  Ask your doctor about side effects or reactions to medicines that you should watch for. Contact a doctor if you:  Think you are having a reaction to the medicine you are taking.  Have headaches that keep coming back (recurring).  Feel dizzy.  Have swelling in your ankles.  Have trouble with your vision. Get help right away if you:  Get a very bad headache.  Start to feel mixed up (confused).  Feel weak or numb.  Feel faint.  Have very bad pain in your: ? Chest. ? Belly (abdomen).  Throw up more than once.  Have trouble breathing. Summary  Hypertension is another name for high blood pressure.  High blood pressure forces your heart to work harder to pump blood.  For most  people, a normal blood pressure is less than 120/80.  Making healthy choices can help lower blood pressure. If your blood pressure does not get lower with healthy choices, you may need to take medicine. This information is not intended to replace advice given to you by your health care provider. Make sure you discuss any  questions you have with your health care provider. Document Revised: 10/04/2017 Document Reviewed: 10/04/2017 Elsevier Patient Education  2020 [employer].

## 2019-07-09 ENCOUNTER — Encounter: Payer: Self-pay | Admitting: Nurse Practitioner

## 2019-07-09 ENCOUNTER — Other Ambulatory Visit: Payer: Self-pay

## 2019-07-09 ENCOUNTER — Ambulatory Visit (INDEPENDENT_AMBULATORY_CARE_PROVIDER_SITE_OTHER): Payer: 59 | Admitting: Nurse Practitioner

## 2019-07-09 VITALS — BP 139/77 | HR 81 | Temp 96.8°F | Ht 69.0 in | Wt 215.6 lb

## 2019-07-09 DIAGNOSIS — R21 Rash and other nonspecific skin eruption: Secondary | ICD-10-CM | POA: Diagnosis not present

## 2019-07-09 DIAGNOSIS — I10 Essential (primary) hypertension: Secondary | ICD-10-CM

## 2019-07-09 NOTE — Assessment & Plan Note (Signed)
Patients hypertension is well managed on current medication, no changes to current dose. Patient will follow up in 3 months. He will continue to work on a low sodium healthy diet, continue exercise. And continue to monitor Blood pressure and call with changes/uncontrolled blood pressure.

## 2019-07-09 NOTE — Progress Notes (Addendum)
Established Patient Office Visit  Subjective:  Patient ID: Harold Bullock, male    DOB: [birth year]  Age: 54 y.o. MRN: [id number]  CC:  Chief Complaint  Patient presents with  . Hypertension    HPI Harold Bullock presents for follow up of hypertension. Patient was diagnosed in 2021. The patient is tolerating the medication well without side effects. Compliance with treatment has been good; including taking medication as directed , maintains a healthy diet and regular exercise regimen , and following up as directed.    Rash on patients nose is not completely resolved but better than what it looked like on last visit. This has been an ongoing problem in the last month. Patient uses a CPAP and wears a mask during the day that keeps his face moist most of the time.  Patient is reporting mild to no facial itch today and is using medication as directed.  Past Medical History:  Diagnosis Date  . OSA (obstructive sleep apnea) 05/21/2015  . Sleep apnea     Past Surgical History:  Procedure Laterality Date  . BACK SURGERY  2005     Family History  Problem Relation Age of Onset  . Alzheimer's disease Mother   . Cancer Maternal Grandfather   . Cancer Maternal Uncle   . Cancer Paternal Grandmother     Social History   Socioeconomic History  . Marital status: Married    Spouse name: Not on file  . Number of children: Not on file  . Years of education: Not on file  . Highest education level: Not on file  Occupational History  . Occupation: [employer] doctor  Tobacco Use  . Smoking status: Current Every Day Smoker    Packs/day: 1.00    Years: 20.00    Pack years: 20.00    Types: Cigarettes    Last attempt to quit: 05/27/2016    Years since quitting: 3.1  . Smokeless tobacco: Never Used  Substance and Sexual Activity  . Alcohol use: Yes    Alcohol/week: 0.0 standard drinks    Comment: 6 beers/daily  . Drug use: No  . Sexual activity: Not on file  Other Topics Concern  . Not  on file  Social History Narrative  . Not on file   Social Determinants of Health   Financial Resource Strain:   . Difficulty of Paying Living Expenses:   Food Insecurity:   . Worried About Programme researcher, broadcasting/film/video in the Last Year:   . Barista in the Last Year:   Transportation Needs:   . Freight forwarder (Medical):   Marland Kitchen Lack of Transportation (Non-Medical):   Physical Activity:   . Days of Exercise per Week:   . Minutes of Exercise per Session:   Stress:   . Feeling of Stress :   Social Connections:   . Frequency of Communication with Friends and Family:   . Frequency of Social Gatherings with Friends and Family:   . Attends Religious Services:   . Active Member of Clubs or Organizations:   . Attends Banker Meetings:   Marland Kitchen Marital Status:   Intimate Partner Violence:   . Fear of Current or Ex-Partner:   . Emotionally Abused:   Marland Kitchen Physically Abused:   . Sexually Abused:     Outpatient Medications Prior to Visit  Medication Sig Dispense Refill  . lisinopril (ZESTRIL) 5 MG tablet Take 1 tablet (5 mg total) by mouth daily. 90 tablet 3  .  clindamycin (CLEOCIN T) 1 % external solution Apply topically 2 (two) times daily. (Patient not taking: Reported on 07/09/2019) 30 mL 0  . predniSONE (STERAPRED UNI-PAK 21 TAB) 10 MG (21) TBPK tablet 60 mg po day 1, 50 mg day 2, 40 mg day 3, 30 mg day 4, 20 mg  day 5, 10 mg day 6 1 each 0   No facility-administered medications prior to visit.    No Known Allergies  ROS Review of Systems  Constitutional: Negative for activity change, appetite change and fatigue.  HENT: Negative.   Eyes: Negative.   Respiratory: Negative for chest tightness and shortness of breath.   Cardiovascular: Negative for chest pain and palpitations.  Gastrointestinal: Negative for abdominal pain, nausea and vomiting.  Endocrine: Negative.   Genitourinary: Negative for difficulty urinating.  Neurological: Negative for light-headedness and  headaches.  Psychiatric/Behavioral: The patient is not nervous/anxious.       Objective:    Physical Exam  Constitutional: He is oriented to person, place, and time. He appears well-developed and well-nourished.  HENT:  Head: Normocephalic.  Mouth/Throat: Oropharynx is clear and moist.  Eyes: Conjunctivae are normal.  Cardiovascular: Normal rate, regular rhythm and normal heart sounds.  Pulmonary/Chest: Breath sounds normal.  Abdominal: Bowel sounds are normal.  Musculoskeletal:        General: No tenderness.     Cervical back: Neck supple.  Neurological: He is alert and oriented to person, place, and time.  Skin: Skin is warm. No rash noted. No erythema.  Psychiatric: He has a normal mood and affect. His behavior is normal.    BP 139/77   Pulse 81   Temp (!) 96.8 F (36 C) (Temporal)   Ht 5\' 9"  (1.753 m)   Wt 215 lb 9.6 oz (97.8 kg)   SpO2 100%   BMI 31.84 kg/m  Wt Readings from Last 3 Encounters:  07/09/19 215 lb 9.6 oz (97.8 kg)  06/24/19 219 lb 9.6 oz (99.6 kg)  06/24/19 220 lb (99.8 kg)     Health Maintenance Due  Topic Date Due  . COVID-19 Vaccine (1) Never done  . HIV Screening  Never done  . COLONOSCOPY  Never done  . COLON CANCER SCREENING ANNUAL FOBT  02/13/2018    There are no preventive care reminders to display for this patient.  No results found for: TSH Lab Results  Component Value Date   WBC 5.0 01/05/2015   HGB 14.3 01/05/2015   HCT 42.4 01/05/2015   MCV 96 01/05/2015   PLT 230 01/05/2015   Lab Results  Component Value Date   NA 140 02/13/2017   K 4.4 02/13/2017   CO2 22 02/13/2017   GLUCOSE 124 (H) 02/13/2017   BUN 15 02/13/2017   CREATININE 1.11 02/13/2017   BILITOT 0.2 02/13/2017   ALKPHOS 98 02/13/2017   AST 26 02/13/2017   ALT 37 02/13/2017   PROT 7.6 02/13/2017   ALBUMIN 4.9 02/13/2017   CALCIUM 10.1 02/13/2017   Lab Results  Component Value Date   CHOL 232 (H) 01/05/2015   Lab Results  Component Value Date   HDL  45 01/05/2015   Lab Results  Component Value Date   LDLCALC 144 (H) 01/05/2015   Lab Results  Component Value Date   TRIG 214 (H) 01/05/2015   Lab Results  Component Value Date   CHOLHDL 5.2 (H) 01/05/2015   No results found for: HGBA1C    Assessment & Plan:   Problem List Items Addressed This  Visit      Cardiovascular and Mediastinum   Hypertension - Primary    Patients hypertension is well managed on current medication, no changes to current dose. Patient will follow up in 3 months. He will continue to work on a low sodium healthy diet, continue exercise. And continue to monitor Blood pressure and call with changes/uncontrolled blood pressure.         No orders of the defined types were placed in this encounter.   Follow-up: Return in about 3 months (around 10/09/2019).    Ivy Lynn, NP

## 2019-07-09 NOTE — Patient Instructions (Addendum)
Hypertension Patients hypertension is well managed on current medication, no changes to current dose. Patient will follow up in 3 months. He will continue to work on a low sodium healthy diet, continue exercise. And continue to monitor Blood pressure and call with changes/uncontrolled blood pressure.   Face rash is moderately managed, rash on patients nose is not completely resolved but better than what it looked like on last visit. Advised patient to continue with cleocin, keep skin clean and dry. And follow up with worsening or unresolved rash.  Hypertension, Adult Hypertension is another name for high blood pressure. High blood pressure forces your heart to work harder to pump blood. This can cause problems over time. There are two numbers in a blood pressure reading. There is a top number (systolic) over a bottom number (diastolic). It is best to have a blood pressure that is below 120/80. Healthy choices can help lower your blood pressure, or you may need medicine to help lower it. What are the causes? The cause of this condition is not known. Some conditions may be related to high blood pressure. What increases the risk?  Smoking.  Having type 2 diabetes mellitus, high cholesterol, or both.  Not getting enough exercise or physical activity.  Being overweight.  Having too much fat, sugar, calories, or salt (sodium) in your diet.  Drinking too much alcohol.  Having long-term (chronic) kidney disease.  Having a family history of high blood pressure.  Age. Risk increases with age.  Race. You may be at higher risk if you are African American.  Gender. Men are at higher risk than women before age 15. After age 43, women are at higher risk than men.  Having obstructive sleep apnea.  Stress. What are the signs or symptoms?  High blood pressure may not cause symptoms. Very high blood pressure (hypertensive crisis) may cause: ? Headache. ? Feelings of worry or nervousness  (anxiety). ? Shortness of breath. ? Nosebleed. ? A feeling of being sick to your stomach (nausea). ? Throwing up (vomiting). ? Changes in how you see. ? Very bad chest pain. ? Seizures. How is this treated?  This condition is treated by making healthy lifestyle changes, such as: ? Eating healthy foods. ? Exercising more. ? Drinking less alcohol.  Your health care provider may prescribe medicine if lifestyle changes are not enough to get your blood pressure under control, and if: ? Your top number is above 130. ? Your bottom number is above 80.  Your personal target blood pressure may vary. Follow these instructions at home: Eating and drinking   If told, follow the DASH eating plan. To follow this plan: ? Fill one half of your plate at each meal with fruits and vegetables. ? Fill one fourth of your plate at each meal with whole grains. Whole grains include whole-wheat pasta, brown rice, and whole-grain bread. ? Eat or drink low-fat dairy products, such as skim milk or low-fat yogurt. ? Fill one fourth of your plate at each meal with low-fat (lean) proteins. Low-fat proteins include fish, chicken without skin, eggs, beans, and tofu. ? Avoid fatty meat, cured and processed meat, or chicken with skin. ? Avoid pre-made or processed food.  Eat less than 1,500 mg of salt each day.  Do not drink alcohol if: ? Your doctor tells you not to drink. ? You are pregnant, may be pregnant, or are planning to become pregnant.  If you drink alcohol: ? Limit how much you use to:  0-1  drink a day for women.  0-2 drinks a day for men. ? Be aware of how much alcohol is in your drink. In the [place] one drink equals one 12 oz bottle of beer (355 mL), one 5 oz glass of wine (148 mL), or one 1 oz glass of hard liquor (44 mL). Lifestyle   Work with your doctor to stay at a healthy weight or to lose weight. Ask your doctor what the best weight is for you.  Get at least 30 minutes of exercise  most days of the week. This may include walking, swimming, or biking.  Get at least 30 minutes of exercise that strengthens your muscles (resistance exercise) at least 3 days a week. This may include lifting weights or doing Pilates.  Do not use any products that contain nicotine or tobacco, such as cigarettes, e-cigarettes, and chewing tobacco. If you need help quitting, ask your doctor.  Check your blood pressure at home as told by your doctor.  Keep all follow-up visits as told by your doctor. This is important. Medicines  Take over-the-counter and prescription medicines only as told by your doctor. Follow directions carefully.  Do not skip doses of blood pressure medicine. The medicine does not work as well if you skip doses. Skipping doses also puts you at risk for problems.  Ask your doctor about side effects or reactions to medicines that you should watch for. Contact a doctor if you:  Think you are having a reaction to the medicine you are taking.  Have headaches that keep coming back (recurring).  Feel dizzy.  Have swelling in your ankles.  Have trouble with your vision. Get help right away if you:  Get a very bad headache.  Start to feel mixed up (confused).  Feel weak or numb.  Feel faint.  Have very bad pain in your: ? Chest. ? Belly (abdomen).  Throw up more than once.  Have trouble breathing. Summary  Hypertension is another name for high blood pressure.  High blood pressure forces your heart to work harder to pump blood.  For most people, a normal blood pressure is less than 120/80.  Making healthy choices can help lower blood pressure. If your blood pressure does not get lower with healthy choices, you may need to take medicine. This information is not intended to replace advice given to you by your health care provider. Make sure you discuss any questions you have with your health care provider. Document Revised: 10/04/2017 Document Reviewed:  10/04/2017 Elsevier Patient Education  2020 [employer].

## 2019-07-09 NOTE — Assessment & Plan Note (Signed)
Face rash is moderately managed, rash on patients nose is not completely resolved but better than what it looked like on last visit. Advised patient to continue with cleocin, keep skin clean and dry. And follow up with worsening or unresolved rash.

## 2019-07-11 ENCOUNTER — Ambulatory Visit (INDEPENDENT_AMBULATORY_CARE_PROVIDER_SITE_OTHER): Payer: Self-pay | Admitting: Nurse Practitioner

## 2019-07-11 ENCOUNTER — Other Ambulatory Visit: Payer: Self-pay

## 2019-07-11 DIAGNOSIS — Z024 Encounter for examination for driving license: Secondary | ICD-10-CM

## 2019-07-11 LAB — URINALYSIS
Bilirubin, UA: NEGATIVE
Glucose, UA: NEGATIVE
Ketones, UA: NEGATIVE
Leukocytes,UA: NEGATIVE
Nitrite, UA: NEGATIVE
Protein,UA: NEGATIVE
RBC, UA: NEGATIVE
Specific Gravity, UA: 1.02 (ref 1.005–1.030)
Urobilinogen, Ur: 0.2 mg/dL (ref 0.2–1.0)
pH, UA: 7 (ref 5.0–7.5)

## 2019-07-11 NOTE — Progress Notes (Signed)
[employer]- see scanned document 

## 2019-10-09 ENCOUNTER — Ambulatory Visit: Payer: Self-pay | Admitting: Family Medicine

## 2019-11-29 ENCOUNTER — Ambulatory Visit (INDEPENDENT_AMBULATORY_CARE_PROVIDER_SITE_OTHER): Payer: 59 | Admitting: Nurse Practitioner

## 2019-11-29 DIAGNOSIS — J0101 Acute recurrent maxillary sinusitis: Secondary | ICD-10-CM | POA: Diagnosis not present

## 2019-11-29 MED ORDER — AMOXICILLIN-POT CLAVULANATE 875-125 MG PO TABS: 1.0000 | ORAL_TABLET | Freq: Two times a day (BID) | ORAL | 0 refills | Status: DC

## 2019-11-29 NOTE — Progress Notes (Signed)
Virtual Visit via telephone Note Due to COVID-19 pandemic this visit was conducted virtually. This visit type was conducted due to national recommendations for restrictions regarding the COVID-19 Pandemic (e.g. social distancing, sheltering in place) in an effort to limit this patient's exposure and mitigate transmission in our community. All issues noted in this document were discussed and addressed.  A physical exam was not performed with this format.  I connected with Harold Bullock on 11/29/19 at 2:45 by telephone and verified that I am speaking with the correct person using two identifiers. Harold Bullock is currently located at home and no one is currently with him during visit. The provider, [name] [name], FNP is located in their office at time of visit.  I discussed the limitations, risks, security and privacy concerns of performing an evaluation and management service by telephone and the availability of in person appointments. I also discussed with the patient that there may be a patient responsible charge related to this service. The patient expressed understanding and agreed to proceed.   History and Present Illness:   Chief Complaint: Sinusitis   HPI Patient calls in stating he develop a runny nose and scratchy throat. Started getting worse throughout week. Ears hurst when he blows his nose. He denies fever , no loss of taste s or smell. No body aches.   Review of Systems  Constitutional: Negative for chills and fever.  HENT: Positive for congestion, ear pain, sinus pain and sore throat.   Respiratory: Positive for cough.   Neurological: Positive for headaches.  All other systems reviewed and are negative.    Observations/Objective: Alert and oriented- answers all questions appropriately No distress Voice hoarse Slight cough.   Assessment and Plan: Harold Bullock in today with chief complaint of Sinusitis   1. Acute recurrent maxillary sinusitis 1.  Take meds as prescribed 2. Use a cool mist humidifier especially during the winter months and when heat has been humid. 3. Use saline nose sprays frequently 4. Saline irrigations of the nose can be very helpful if done frequently.  * 4X daily for 1 week*  * Use of a nettie pot can be helpful with this. Follow directions with this* 5. Drink plenty of fluids 6. Keep thermostat turn down low 7.For any cough or congestion  Use plain Mucinex- regular strength or max strength is fine   * Children- consult with Pharmacist for dosing 8. For fever or aces or pains- take tylenol or ibuprofen appropriate for age and weight.  * for fevers greater than 101 orally you may alternate ibuprofen and tylenol every  3 hours.   Meds ordered this encounter  Medications  . amoxicillin-clavulanate (AUGMENTIN) 875-125 MG tablet    Sig: Take 1 tablet by mouth 2 (two) times daily.    Dispense:  14 tablet    Refill:  0    Order Specific Question:   Supervising Provider    Answer:   Arville Care A [[id number]]        Follow Up Instructions: prn    I discussed the assessment and treatment plan with the patient. The patient was provided an opportunity to ask questions and all were answered. The patient agreed with the plan and demonstrated an understanding of the instructions.   The patient was advised to call back or seek an in-person evaluation if the symptoms worsen or if the condition fails to improve as anticipated.  The above assessment and management plan was discussed with the patient. The  patient verbalized understanding of and has agreed to the management plan. Patient is aware to call the clinic if symptoms persist or worsen. Patient is aware when to return to the clinic for a follow-up visit. Patient educated on when it is appropriate to go to the emergency department.   Time call ended:  2:58  I provided 18 minutes of non-face-to-face time during this encounter.    [name] [name],  FNP

## 2020-09-08 ENCOUNTER — Ambulatory Visit (INDEPENDENT_AMBULATORY_CARE_PROVIDER_SITE_OTHER): Payer: 59 | Admitting: Nurse Practitioner

## 2020-09-08 ENCOUNTER — Ambulatory Visit (INDEPENDENT_AMBULATORY_CARE_PROVIDER_SITE_OTHER): Payer: 59

## 2020-09-08 ENCOUNTER — Other Ambulatory Visit: Payer: Self-pay

## 2020-09-08 ENCOUNTER — Encounter: Payer: Self-pay | Admitting: Nurse Practitioner

## 2020-09-08 VITALS — BP 193/104 | HR 84 | Temp 97.7°F | Ht 69.0 in | Wt 220.8 lb

## 2020-09-08 DIAGNOSIS — R079 Chest pain, unspecified: Secondary | ICD-10-CM

## 2020-09-08 DIAGNOSIS — M25572 Pain in left ankle and joints of left foot: Secondary | ICD-10-CM | POA: Diagnosis not present

## 2020-09-08 DIAGNOSIS — E7849 Other hyperlipidemia: Secondary | ICD-10-CM

## 2020-09-08 DIAGNOSIS — F17200 Nicotine dependence, unspecified, uncomplicated: Secondary | ICD-10-CM

## 2020-09-08 DIAGNOSIS — I10 Essential (primary) hypertension: Secondary | ICD-10-CM

## 2020-09-08 HISTORY — DX: Other hyperlipidemia: E78.49

## 2020-09-08 MED ORDER — LISINOPRIL 20 MG PO TABS: 20.0000 mg | ORAL_TABLET | Freq: Every day | ORAL | 3 refills | Status: DC

## 2020-09-08 NOTE — Patient Instructions (Signed)
Managing the Challenge of Quitting Smoking Quitting smoking is a physical and mental challenge. You will face cravings, withdrawal symptoms, and temptation. Before quitting, work with your health care provider to make a plan that can help you manage quitting. Preparation canhelp you quit and keep you from giving in. How to manage lifestyle changes Managing stress Stress can make you want to smoke, and wanting to smoke may cause stress. It is important to find ways to manage your stress. You might try some of the following: Practice relaxation techniques. Breathe slowly and deeply, in through your nose and out through your mouth. Listen to music. Soak in a bath or take a shower. Imagine a peaceful place or vacation. Get some support. Talk with family or friends about your stress. Join a support group. Talk with a counselor or therapist. Get some physical activity. Go for a walk, run, or bike ride. Play a favorite sport. Practice yoga.  Medicines Talk with your health care provider about medicines that might help you dealwith cravings and make quitting easier for you. Relationships Social situations can be difficult when you are quitting smoking. To manage this, you can: Avoid parties and other social situations where people might be smoking. Avoid alcohol. Leave right away if you have the urge to smoke. Explain to your family and friends that you are quitting smoking. Ask for support and let them know you might be a bit grumpy. Plan activities where smoking is not an option. General instructions Be aware that many people gain weight after they quit smoking. However, not everyone does. To keep from gaining weight, have a plan in place before you quit and stick to the plan after you quit. Your plan should include: Having healthy snacks. When you have a craving, it may help to: Eat popcorn, carrots, celery, or other cut vegetables. Chew sugar-free gum. Changing how you eat. Eat small  portion sizes at meals. Eat 4-6 small meals throughout the day instead of 1-2 large meals a day. Be mindful when you eat. Do not watch television or do other things that might distract you as you eat. Exercising regularly. Make time to exercise each day. If you do not have time for a long workout, do short bouts of exercise for 5-10 minutes several times a day. Do some form of strengthening exercise, such as weight lifting. Do some exercise that gets your heart beating and causes you to breathe deeply, such as walking fast, running, swimming, or biking. This is very important. Drinking plenty of water or other low-calorie or no-calorie drinks. Drink 6-8 glasses of water daily.  How to recognize withdrawal symptoms Your body and mind may experience discomfort as you try to get used to not having nicotine in your system. These effects are called withdrawal symptoms. They may include: Feeling hungrier than normal. Having trouble concentrating. Feeling irritable or restless. Having trouble sleeping. Feeling depressed. Craving a cigarette. To manage withdrawal symptoms: Avoid places, people, and activities that trigger your cravings. Remember why you want to quit. Get plenty of sleep. Avoid coffee and other caffeinated drinks. These may worsen some of your symptoms. These symptoms may surprise you. But be assured that they are normal to havewhen quitting smoking. How to manage cravings Come up with a plan for how to deal with your cravings. The plan should include the following: A definition of the specific situation you want to deal with. An alternative action you will take. A clear idea for how this action will help. The   name of someone who might help you with this. Cravings usually last for 5-10 minutes. Consider taking the following actions to help you with your plan to deal with cravings: Keep your mouth busy. Chew sugar-free gum. Suck on hard candies or a straw. Brush your  teeth. Keep your hands and body busy. Change to a different activity right away. Squeeze or play with a ball. Do an activity or a hobby, such as making bead jewelry, practicing needlepoint, or working with wood. Mix up your normal routine. Take a short exercise break. Go for a quick walk or run up and down stairs. Focus on doing something kind or helpful for someone else. Call a friend or family member to talk during a craving. Join a support group. Contact a quitline. Where to find support To get help or find a support group: Call the National Cancer Institute's Smoking Quitline: 1-800-QUIT NOW (784-8669) Visit the website of the Substance Abuse and Mental Health Services Administration: www.samhsa.gov Text QUIT to SmokefreeTXT: 478848 Where to find more information Visit these websites to find more information on quitting smoking: National Cancer Institute: www.smokefree.gov American Lung Association: www.lung.org American Cancer Society: www.cancer.org Centers for Disease Control and Prevention: www.cdc.gov American Heart Association: www.heart.org Contact a health care provider if: You want to change your plan for quitting. The medicines you are taking are not helping. Your eating feels out of control or you cannot sleep. Get help right away if: You feel depressed or become very anxious. Summary Quitting smoking is a physical and mental challenge. You will face cravings, withdrawal symptoms, and temptation to smoke again. Preparation can help you as you go through these challenges. Try different techniques to manage stress, handle social situations, and prevent weight gain. You can deal with cravings by keeping your mouth busy (such as by chewing gum), keeping your hands and body busy, calling family or friends, or contacting a quitline for people who want to quit smoking. You can deal with withdrawal symptoms by avoiding places where people smoke, getting plenty of rest, and  avoiding drinks with caffeine. This information is not intended to replace advice given to you by your health care provider. Make sure you discuss any questions you have with your healthcare provider. Document Revised: 11/13/2018 Document Reviewed: 11/13/2018 Elsevier Patient Education  2022 Elsevier Inc.  

## 2020-09-08 NOTE — Progress Notes (Signed)
   Subjective:    Patient ID: Harold Bullock, male    DOB: Jul 20, 1965, 55 y.o.   MRN: [id number]   Chief Complaint: Ankle Pain (left)   HPI Patient comes in today c/o:  - left ankle pain. Started a couple of months ago. He hikes a lot and thinks that irritates it. - he has been having chest pain. Occurs a couple of times  a day to no days. Pain comes and goes. Describes pain as something sitting on chest. No sob. Hs smokes over a pack a day.   Review of Systems  HENT: Negative.    Respiratory:  Positive for cough, shortness of breath and wheezing.   Cardiovascular:  Positive for chest pain. Negative for palpitations and leg swelling.  Genitourinary: Negative.   Neurological: Negative.   Psychiatric/Behavioral: Negative.    All other systems reviewed and are negative.     Objective:   Physical Exam Vitals and nursing note reviewed.  Constitutional:      Appearance: Normal appearance.  Cardiovascular:     Rate and Rhythm: Normal rate and regular rhythm.     Heart sounds: Normal heart sounds.  Pulmonary:     Effort: Pulmonary effort is normal.     Breath sounds: Normal breath sounds.  Abdominal:     Palpations: Abdomen is soft.  Skin:    General: Skin is warm.  Neurological:     General: No focal deficit present.     Mental Status: He is alert and oriented to person, place, and time.  Psychiatric:        Mood and Affect: Mood normal.        Behavior: Behavior normal.    BP (!) 166/88   Pulse 84   Temp 97.7 F (36.5 C) (Temporal)   Ht $R'5\' 9"'TQ$  (1.753 m)   Wt 220 lb 12.8 oz (100.2 kg)   SpO2 99%   BMI 32.61 kg/m   EKG- [facility]-[name] Hassell Done, FNP      Assessment & Plan:   [name] in today with chief complaint of Ankle Pain (left)   1. Acute left ankle pain Wear brace on ankle - DG Ankle Complete Left; Future  2. Primary hypertension Low sodium diet - lisinopril (ZESTRIL) 20 MG tablet; Take 1 tablet (20 mg total) by mouth daily.  Dispense: 90  tablet; Refill: 3 - CBC with Differential/Platelet - CMP14+EGFR - Lipid panel  3. Chest pain, unspecified type Referral to cardiology Avoid strenuous activity - EKG 12-Lead - Ambulatory referral to Cardiology  4. Smoker Smoking cessation discussed Needs to talk to wife abut quiting in order for him to be successful.    The above assessment and management plan was discussed with the patient. The patient verbalized understanding of and has agreed to the management plan. Patient is aware to call the clinic if symptoms persist or worsen. Patient is aware when to return to the clinic for a follow-up visit. Patient educated on when it is appropriate to go to the emergency department.   [name] Hassell Done, FNP

## 2020-09-09 LAB — LIPID PANEL
Chol/HDL Ratio: 4.5 ratio (ref 0.0–5.0)
Cholesterol, Total: 207 mg/dL — ABNORMAL HIGH (ref 100–199)
HDL: 46 mg/dL (ref >39)
LDL Chol Calc (NIH): 119 mg/dL — ABNORMAL HIGH (ref 0–99)
Triglycerides: 242 mg/dL — ABNORMAL HIGH (ref 0–149)
VLDL Cholesterol Cal: 42 mg/dL — ABNORMAL HIGH (ref 5–40)

## 2020-09-09 LAB — CBC WITH DIFFERENTIAL/PLATELET
Basophils Absolute: 0.1 10*3/uL (ref 0.0–0.2)
Basos: 1 %
EOS (ABSOLUTE): 0.1 10*3/uL (ref 0.0–0.4)
Eos: 2 %
Hematocrit: 42.8 % (ref 37.5–51.0)
Hemoglobin: 15 g/dL (ref 13.0–17.7)
Immature Grans (Abs): 0 10*3/uL (ref 0.0–0.1)
Immature Granulocytes: 1 %
Lymphocytes Absolute: 1.1 10*3/uL (ref 0.7–3.1)
Lymphs: 20 %
MCH: 33.8 pg — ABNORMAL HIGH (ref 26.6–33.0)
MCHC: 35 g/dL (ref 31.5–35.7)
MCV: 96 fL (ref 79–97)
Monocytes Absolute: 0.5 10*3/uL (ref 0.1–0.9)
Monocytes: 9 %
Neutrophils Absolute: 3.7 10*3/uL (ref 1.4–7.0)
Neutrophils: 67 %
Platelets: 202 10*3/uL (ref 150–450)
RBC: 4.44 x10E6/uL (ref 4.14–5.80)
RDW: 12 % (ref 11.6–15.4)
WBC: 5.4 10*3/uL (ref 3.4–10.8)

## 2020-09-09 LAB — CMP14+EGFR
ALT: 21 IU/L (ref 0–44)
AST: 20 IU/L (ref 0–40)
Albumin/Globulin Ratio: 2 (ref 1.2–2.2)
Albumin: 4.7 g/dL (ref 3.8–4.9)
Alkaline Phosphatase: 100 IU/L (ref 44–121)
BUN/Creatinine Ratio: 14 (ref 9–20)
BUN: 13 mg/dL (ref 6–24)
Bilirubin Total: 0.3 mg/dL (ref 0.0–1.2)
CO2: 24 mmol/L (ref 20–29)
Calcium: 9.3 mg/dL (ref 8.7–10.2)
Chloride: 103 mmol/L (ref 96–106)
Creatinine, Ser: 0.91 mg/dL (ref 0.76–1.27)
Globulin, Total: 2.3 g/dL (ref 1.5–4.5)
Glucose: 127 mg/dL — ABNORMAL HIGH (ref 65–99)
Potassium: 4.7 mmol/L (ref 3.5–5.2)
Sodium: 140 mmol/L (ref 134–144)
Total Protein: 7 g/dL (ref 6.0–8.5)
eGFR: 99 mL/min/{1.73_m2} (ref >59)

## 2020-10-06 ENCOUNTER — Encounter: Payer: Self-pay | Admitting: Nurse Practitioner

## 2020-10-06 ENCOUNTER — Other Ambulatory Visit: Payer: Self-pay

## 2020-10-06 ENCOUNTER — Ambulatory Visit (INDEPENDENT_AMBULATORY_CARE_PROVIDER_SITE_OTHER): Payer: 59 | Admitting: Nurse Practitioner

## 2020-10-06 VITALS — BP 121/69 | HR 84 | Temp 97.9°F | Resp 20 | Ht 69.0 in | Wt 220.0 lb

## 2020-10-06 DIAGNOSIS — I10 Essential (primary) hypertension: Secondary | ICD-10-CM | POA: Diagnosis not present

## 2020-10-06 NOTE — Progress Notes (Signed)
   Subjective:    Patient ID: Harold Bullock, male    DOB: 04-08-1965, 55 y.o.   MRN: [id number]   Chief Complaint: hypertension  HPI Patient comes in today for recheck of blood pressure. He was last seen in office on 09/08/20 with sob and chest pain. B/p was 166/88. We started him on lisinorpil 20mg  daily. He is doing well with medication and having no side effects. He was referred to cardiology for cardiac work up and has appointment in October. He has been doing well since last visit. He has had occasional chest pain. Denies sob with episodes. Episodes last about several minutes when occurs. He is still going hiking on weekends. He has not been checking his blood pressure at home.    Review of Systems  Constitutional:  Negative for diaphoresis.  Eyes:  Negative for pain.  Respiratory:  Negative for shortness of breath.   Cardiovascular:  Negative for chest pain, palpitations and leg swelling.  Gastrointestinal:  Negative for abdominal pain.  Endocrine: Negative for polydipsia.  Skin:  Negative for rash.  Neurological:  Negative for dizziness, weakness and headaches.  Hematological:  Does not bruise/bleed easily.  All other systems reviewed and are negative.     Objective:   Physical Exam Vitals and nursing note reviewed.  Constitutional:      Appearance: Normal appearance. He is obese.  Cardiovascular:     Rate and Rhythm: Normal rate and regular rhythm.     Heart sounds: Normal heart sounds.  Pulmonary:     Effort: Pulmonary effort is normal.     Breath sounds: Normal breath sounds.  Skin:    General: Skin is warm.  Neurological:     General: No focal deficit present.     Mental Status: He is alert and oriented to person, place, and time.  Psychiatric:        Mood and Affect: Mood normal.        Behavior: Behavior normal.    BP 121/69   Pulse 84   Temp 97.9 F (36.6 C) (Temporal)   Resp 20   Ht 5\' 9"  (1.753 m)   Wt 220 lb (99.8 kg)   SpO2 98%   BMI 32.49 kg/m         Assessment & Plan:  November in today with chief complaint of Recheck BP   1. Primary hypertension Dash diet Continue lisinopril Keep appointment with cardiology    The above assessment and management plan was discussed with the patient. The patient verbalized understanding of and has agreed to the management plan. Patient is aware to call the clinic if symptoms persist or worsen. Patient is aware when to return to the clinic for a follow-up visit. Patient educated on when it is appropriate to go to the emergency department.   [name] , FNP

## 2020-10-06 NOTE — Patient Instructions (Signed)
https://www.nhlbi.nih.gov/files/docs/public/heart/dash_brief.pdf">  DASH Eating Plan DASH stands for Dietary Approaches to Stop Hypertension. The DASH eating plan is a healthy eating plan that has been shown to: Reduce high blood pressure (hypertension). Reduce your risk for type 2 diabetes, heart disease, and stroke. Help with weight loss. What are tips for following this plan? Reading food labels Check food labels for the amount of salt (sodium) per serving. Choose foods with less than 5 percent of the Daily Value of sodium. Generally, foods with less than 300 milligrams (mg) of sodium per serving fit into this eating plan. To find whole grains, look for the word "whole" as the first word in the ingredient list. Shopping Buy products labeled as "low-sodium" or "no salt added." Buy fresh foods. Avoid canned foods and pre-made or frozen meals. Cooking Avoid adding salt when cooking. Use salt-free seasonings or herbs instead of table salt or sea salt. Check with your health care provider or pharmacist before using salt substitutes. Do not fry foods. Cook foods using healthy methods such as baking, boiling, grilling, roasting, and broiling instead. Cook with heart-healthy oils, such as olive, canola, avocado, soybean, or sunflower oil. Meal planning  Eat a balanced diet that includes: 4 or more servings of fruits and 4 or more servings of vegetables each day. Try to fill one-half of your plate with fruits and vegetables. 6-8 servings of whole grains each day. Less than 6 oz (170 g) of lean meat, poultry, or fish each day. A 3-oz (85-g) serving of meat is about the same size as a deck of cards. One egg equals 1 oz (28 g). 2-3 servings of low-fat dairy each day. One serving is 1 cup (237 mL). 1 serving of nuts, seeds, or beans 5 times each week. 2-3 servings of heart-healthy fats. Healthy fats called omega-3 fatty acids are found in foods such as walnuts, flaxseeds, fortified milks, and eggs.  These fats are also found in cold-water fish, such as sardines, salmon, and mackerel. Limit how much you eat of: Canned or prepackaged foods. Food that is high in trans fat, such as some fried foods. Food that is high in saturated fat, such as fatty meat. Desserts and other sweets, sugary drinks, and other foods with added sugar. Full-fat dairy products. Do not salt foods before eating. Do not eat more than 4 egg yolks a week. Try to eat at least 2 vegetarian meals a week. Eat more home-cooked food and less restaurant, buffet, and fast food.  Lifestyle When eating at a restaurant, ask that your food be prepared with less salt or no salt, if possible. If you drink alcohol: Limit how much you use to: 0-1 drink a day for women who are not pregnant. 0-2 drinks a day for men. Be aware of how much alcohol is in your drink. In the [place] one drink equals one 12 oz bottle of beer (355 mL), one 5 oz glass of wine (148 mL), or one 1 oz glass of hard liquor (44 mL). General information Avoid eating more than 2,300 mg of salt a day. If you have hypertension, you may need to reduce your sodium intake to 1,500 mg a day. Work with your health care provider to maintain a healthy body weight or to lose weight. Ask what an ideal weight is for you. Get at least 30 minutes of exercise that causes your heart to beat faster (aerobic exercise) most days of the week. Activities may include walking, swimming, or biking. Work with your health care provider   or dietitian to adjust your eating plan to your individual calorie needs. What foods should I eat? Fruits All fresh, dried, or frozen fruit. Canned fruit in natural juice (without addedsugar). Vegetables Fresh or frozen vegetables (raw, steamed, roasted, or grilled). Low-sodium or reduced-sodium tomato and vegetable juice. Low-sodium or reduced-sodium tomatosauce and tomato paste. Low-sodium or reduced-sodium canned vegetables. Grains Whole-grain or  whole-wheat bread. Whole-grain or whole-wheat pasta. Brown rice. Oatmeal. Quinoa. Bulgur. Whole-grain and low-sodium cereals. Pita bread.Low-fat, low-sodium crackers. Whole-wheat flour tortillas. Meats and other proteins Skinless chicken or turkey. Ground chicken or turkey. Pork with fat trimmed off. Fish and seafood. Egg whites. Dried beans, peas, or lentils. Unsalted nuts, nut butters, and seeds. Unsalted canned beans. Lean cuts of beef with fat trimmed off. Low-sodium, lean precooked or cured meat, such as sausages or meatloaves. Dairy Low-fat (1%) or fat-free (skim) milk. Reduced-fat, low-fat, or fat-free cheeses. Nonfat, low-sodium ricotta or cottage cheese. Low-fat or nonfatyogurt. Low-fat, low-sodium cheese. Fats and oils Soft margarine without trans fats. Vegetable oil. Reduced-fat, low-fat, or light mayonnaise and salad dressings (reduced-sodium). Canola, safflower, olive, avocado, soybean, andsunflower oils. Avocado. Seasonings and condiments Herbs. Spices. Seasoning mixes without salt. Other foods Unsalted popcorn and pretzels. Fat-free sweets. The items listed above may not be a complete list of foods and beverages you can eat. Contact a dietitian for more information. What foods should I avoid? Fruits Canned fruit in a light or heavy syrup. Fried fruit. Fruit in cream or buttersauce. Vegetables Creamed or fried vegetables. Vegetables in a cheese sauce. Regular canned vegetables (not low-sodium or reduced-sodium). Regular canned tomato sauce and paste (not low-sodium or reduced-sodium). Regular tomato and vegetable juice(not low-sodium or reduced-sodium). Pickles. Olives. Grains Baked goods made with fat, such as croissants, muffins, or some breads. Drypasta or rice meal packs. Meats and other proteins Fatty cuts of meat. Ribs. Fried meat. Bacon. Bologna, salami, and other precooked or cured meats, such as sausages or meat loaves. Fat from the back of a pig (fatback). Bratwurst.  Salted nuts and seeds. Canned beans with added salt. Canned orsmoked fish. Whole eggs or egg yolks. Chicken or turkey with skin. Dairy Whole or 2% milk, cream, and half-and-half. Whole or full-fat cream cheese. Whole-fat or sweetened yogurt. Full-fat cheese. Nondairy creamers. Whippedtoppings. Processed cheese and cheese spreads. Fats and oils Butter. Stick margarine. Lard. Shortening. Ghee. Bacon fat. Tropical oils, suchas coconut, palm kernel, or palm oil. Seasonings and condiments Onion salt, garlic salt, seasoned salt, table salt, and sea salt. Worcestershire sauce. Tartar sauce. Barbecue sauce. Teriyaki sauce. Soy sauce, including reduced-sodium. Steak sauce. Canned and packaged gravies. Fish sauce. Oyster sauce. Cocktail sauce. Store-bought horseradish. Ketchup. Mustard. Meat flavorings and tenderizers. Bouillon cubes. Hot sauces. Pre-made or packaged marinades. Pre-made or packaged taco seasonings. Relishes. Regular saladdressings. Other foods Salted popcorn and pretzels. The items listed above may not be a complete list of foods and beverages you should avoid. Contact a dietitian for more information. Where to find more information National Heart, Lung, and Blood Institute: www.nhlbi.nih.gov American Heart Association: www.heart.org Academy of Nutrition and Dietetics: www.eatright.org National Kidney Foundation: www.kidney.org Summary The DASH eating plan is a healthy eating plan that has been shown to reduce high blood pressure (hypertension). It may also reduce your risk for type 2 diabetes, heart disease, and stroke. When on the DASH eating plan, aim to eat more fresh fruits and vegetables, whole grains, lean proteins, low-fat dairy, and heart-healthy fats. With the DASH eating plan, you should limit salt (sodium) intake to 2,300   mg a day. If you have hypertension, you may need to reduce your sodium intake to 1,500 mg a day. Work with your health care provider or dietitian to adjust  your eating plan to your individual calorie needs. This information is not intended to replace advice given to you by your health care provider. Make sure you discuss any questions you have with your healthcare provider. Document Revised: 12/28/2018 Document Reviewed: 12/28/2018 Elsevier Patient Education  2022 Elsevier Inc.  

## 2020-10-27 ENCOUNTER — Telehealth: Payer: Self-pay | Admitting: Nurse Practitioner

## 2020-10-27 NOTE — Telephone Encounter (Signed)
Please write note for patient to be excused from jury duty- he needs to go to his cardiology appointment.

## 2020-10-27 NOTE — Telephone Encounter (Signed)
Letter written, [place]ed [place] and pt aware

## 2020-11-16 ENCOUNTER — Encounter: Payer: Self-pay | Admitting: Cardiology

## 2020-11-16 NOTE — Progress Notes (Signed)
Primary Care Provider: Bennie Pierini, FNP [facility] Cardiologist: None Electrophysiologist: None  Clinic Note: Chief Complaint  Patient presents with   New Patient (Initial Visit)    Chest pain    ===================================  ASSESSMENT/PLAN   Problem List Items Addressed This Visit       Cardiology Problems   Essential hypertension - Primary (Chronic)    Blood pressure looks pretty good on current dose of lisinopril.  Will monitor.  If there is evidence of significant CAD, would probably consider beta-blocker as well.        Other   OSA (obstructive sleep apnea) (Chronic)   Obesity (BMI 30.0-34.9) (Chronic)    Obviously we discussed importance of weight loss.  This is with diet and exercise.  In order for him to trip notable with exercise, weariness exclude ischemic CAD.  With hypertension, obesity, and TG greater than 242, he does meet criteria for metabolic syndrome.  This puts him at a higher risk for CAD and therefore we are aggressively evaluating for ischemic CAD with a Cardiac CT Angiogram.      Precordial pain    Interestingly, he has this precordial chest pain which sounds M1 with respect very concerning for angina when he describes it as a heaviness and pressure.  Then he also describes a burning sharp stabbing discomfort which is different.  Also confusing is that it can happen with or without exertion.  Concerning features that he he may notice this plus exertional dyspnea going up inclines as opposed to walking on flat ground which could be consistent with stable angina.  States that his features are mixed back between typical and atypical features, will evaluate for coronary ischemia with a cardiac CT angiogram that will allow [place] to identify evidence of coronary artery disease as well as potential physiology indicating ischemic lesions.  Plan: Cardiac CTA with possible CT FFR.      Relevant Orders   CT CORONARY MORPH W/CTA COR  W/SCORE W/CA W/CM &/OR WO/CM   Atherogenic dyslipidemia (Chronic)    Total cholesterol 207 with decent HDL of 46, LDL 119 and triglycerides 341.  The HDL is reassuring, but triglycerides being elevated and LDL being over 100 is somewhat concerning.  Would like to risk stratify and determine how aggressive we need to be to treat.  Since he is actively having chest pain symptoms, we can evaluate both with Cardiac CTA which provides Coronary Calcium Score and plaque burden information.  Anticipate initiating statin based on results.  We just need to determine target levels.       ===================================  HPI:    Harold Bullock is an obese, chronic 1 pack a day smoking 55 y.o. male with a PMH notable for HTN, HLD, and OSA (CPAP) who is being seen today for the evaluation of OCCASIONAL CHEST PAIN at the request of [name], [name], *.  Harold Bullock was last seen on 10/06/2020 by Bennie Pierini, FNP for routine follow-up for blood pressure evaluation.  BP seem to be better controlled on lisinopril 20 mg.  Was noted to have intermittent episode of chest pain on August 2, was referred to cardiology. --> Noted having off-and-on chest pain, times a day some days, and then not at all for another day.  Pain comes and goes.  Described as "someone sitting on his chest ".  Not associated with dyspnea, PND or orthopnea.  No palpitations.  Recent Hospitalizations: None  Reviewed  CV studies:    The following studies  were reviewed today: (if available, images/films reviewed: From Epic Chart or Care Everywhere) None:   Interval History:   Harold Bullock presents here today for cardiology evaluation describing his episodes of chest pain that seem to come and go at random times.  He can have days where he does not have them and have days where he has several episodes.  They may or may not occur with exertion, but the concerning features is that he describes it as a heaviness  sensation of someone sitting on his chest when it occurs and it is sometimes associated with dyspnea. While he sometimes has the discomfort feels like a heaviness and pressure sensation, he also notes a stabbing and burning type symptoms as well.   Spells can last 20 to 30 minutes or even longer.  They can sometimes happen 2-3 times a day, and they can sometimes not happen for couple days.  Not always associated with exertion, and sometimes not associated all with exertion.  He is not that active, but he does enjoy going on hikes in the parks, and does not necessarily notice that if he is walking on flat ground.  However if he walks up an incline or up stairs he will notice it.  He also says that he is somewhat limited in walking by his legs aching, but not keeping him from doing his walking.  He can walk a decent distance without having fatigue.  He really only notes the fatigue and shortness of breath walking up hills.  He has not been able to do as much exercise as he used to do over the last few months because he has been working on building a new home to move in with assistance of his son.  He is about ready to move in, he thinks on that stops may be some of the stress that is going through be relieved.  He says that a lot of the symptoms have all started since he has been under all the stress.   CV Review of Symptoms (Summary) otherwise essentially normal. Cardiovascular ROS: positive for - chest pain and dyspnea on exertion negative for - edema, irregular heartbeat, orthopnea, palpitations, paroxysmal nocturnal dyspnea, rapid heart rate, shortness of breath, or lightheadedness or dizziness, syncope/near syncope or TIA/amaurosis fugax, claudication  REVIEWED OF SYSTEMS   Review of Systems  Constitutional:  Negative for malaise/fatigue and weight loss.  HENT:  Negative for congestion and nosebleeds.   Eyes:  Negative for blurred vision and double vision.  Respiratory:  Negative for cough and  shortness of breath (Only some exertional dyspnea noted above).   Cardiovascular:        Per HPI  Gastrointestinal:  Negative for blood in stool, constipation, heartburn and melena.  Genitourinary:  Negative for frequency and hematuria.  Musculoskeletal:  Positive for myalgias (Leg skin aches when he is walking longer distances, but not limiting.). Negative for back pain.  Neurological:  Negative for dizziness and focal weakness.  Psychiatric/Behavioral:  Negative for depression and memory loss. The patient is not nervous/anxious and does not have insomnia.        He has been under quite a bit of stress lately trying to build a new house.   I have reviewed and (if needed) personally updated the patient's problem list, medications, allergies, past medical and surgical history, social and family history.   PAST MEDICAL HISTORY   Past Medical History:  Diagnosis Date   Essential hypertension    Hyperlipidemia due to dietary  fat intake 09/08/2020   TC 207, TG 242, HDL 46, LDL 119.   Metabolic syndrome    Obesity, hypertension, hypertriglyceridemia.   Obese    OSA (obstructive sleep apnea) 05/21/2015    PAST SURGICAL HISTORY   Past Surgical History:  Procedure Laterality Date   BACK SURGERY  2005     Immunization History  Administered Date(s) Administered   Tdap 01/05/2015    MEDICATIONS/ALLERGIES   Current Meds  Medication Sig   lisinopril (ZESTRIL) 20 MG tablet Take 1 tablet (20 mg total) by mouth daily.   metoprolol tartrate (LOPRESSOR) 50 MG tablet Take 2 tablets (100 mg total) by mouth once for 1 dose. TAKE TWO HOURS PRIOR TO  SCHEDULE CARDIAC TEST    No Known Allergies  SOCIAL HISTORY/FAMILY HISTORY   Reviewed in Epic:   Social History   Tobacco Use   Smoking status: Every Day    Packs/day: 1.00    Years: 20.00    Pack years: 20.00    Types: Cigarettes    Last attempt to quit: 05/27/2016    Years since quitting: 4.4   Smokeless tobacco: Never  Vaping Use    Vaping Use: Never used  Substance Use Topics   Alcohol use: Yes    Alcohol/week: 0.0 standard drinks    Comment: 6 beers/daily   Drug use: No   Social History   Social History Narrative   Not on file   Family History  Problem Relation Age of Onset   Alzheimer's disease Mother    Cancer Maternal Grandfather    Cancer Maternal Uncle    Cancer Paternal Grandmother     OBJCTIVE -PE, EKG, labs   Wt Readings from Last 3 Encounters:  11/17/20 222 lb 3.2 oz (100.8 kg)  10/06/20 220 lb (99.8 kg)  09/08/20 220 lb 12.8 oz (100.2 kg)    Physical Exam: BP 128/60   Pulse 83   Ht 5\' 9"  (1.753 m)   Wt 222 lb 3.2 oz (100.8 kg)   SpO2 97%   BMI 32.81 kg/m  Physical Exam Constitutional:      General: He is not in acute distress.    Appearance: Normal appearance. He is not ill-appearing or toxic-appearing.     Comments: Well-nourished, well-groomed.  HENT:     Head: Normocephalic and atraumatic.  Eyes:     Extraocular Movements: Extraocular movements intact.     Pupils: Pupils are equal, round, and reactive to light.  Neck:     Vascular: No carotid bruit.  Cardiovascular:     Rate and Rhythm: Normal rate and regular rhythm. No extrasystoles are present.    Chest Wall: PMI is not displaced.     Heart sounds: S1 normal and S2 normal. Heart sounds are distant. No murmur heard.   No friction rub. No gallop.  Pulmonary:     Effort: Pulmonary effort is normal. No respiratory distress.     Breath sounds: Normal breath sounds.  Chest:     Chest wall: No tenderness.  Abdominal:     General: Bowel sounds are normal. There is no distension.     Palpations: Abdomen is soft. There is no mass (No HSM or bruit.).     Tenderness: There is no abdominal tenderness.  Musculoskeletal:        General: No swelling (Trivial ankle). Normal range of motion.     Cervical back: Normal range of motion and neck supple.  Skin:    General: Skin is warm and dry.  Neurological:     General: No  focal deficit present.     Mental Status: He is alert and oriented to person, place, and time.     Motor: No weakness.     Gait: Gait normal.  Psychiatric:        Mood and Affect: Mood normal.        Behavior: Behavior normal.        Thought Content: Thought content normal.        Judgment: Judgment normal.     Adult ECG Report  Rate: 83 ;  Rhythm: normal sinus rhythm and Normal axis, intervals and durations. ;   Narrative Interpretation: Relatively normal EKG  Recent Labs: Reviewed Lab Results  Component Value Date   CHOL 207 (H) 09/08/2020   HDL 46 09/08/2020   LDLCALC 119 (H) 09/08/2020   TRIG 242 (H) 09/08/2020   CHOLHDL 4.5 09/08/2020   Lab Results  Component Value Date   CREATININE 0.91 09/08/2020   BUN 13 09/08/2020   NA 140 09/08/2020   K 4.7 09/08/2020   CL 103 09/08/2020   CO2 24 09/08/2020   CBC Latest Ref Rng & Units 09/08/2020 01/05/2015  WBC 3.4 - 10.8 x10E3/uL 5.4 5.0  Hemoglobin 13.0 - 17.7 g/dL 67.6 72.0  Hematocrit 94.7 - 51.0 % 42.8 42.4  Platelets 150 - 450 x10E3/uL 202 230    No results found for: HGBA1C No results found for: TSH  ==================================================  COVID-19 Education: The signs and symptoms of COVID-19 were discussed with the patient and how to seek care for testing (follow up with PCP or arrange E-visit).    I spent a total of 21  minutes with the patient spent in direct patient consultation.  Additional time spent with chart review  / charting (studies, outside notes, etc): 25 min Total Time: 46 min  Current medicines are reviewed at length with the patient today.  (+/- concerns) n/a  This visit occurred during the SARS-CoV-2 public health emergency.  Safety protocols were in place, including screening questions prior to the visit, additional usage of staff PPE, and extensive cleaning of exam room while observing appropriate contact time as indicated for disinfecting solutions.  Notice: This dictation was  prepared with Dragon dictation along with smart phrase technology. Any transcriptional errors that result from this process are unintentional and may not be corrected upon review.   Studies Ordered:  Orders Placed This Encounter  Procedures   CT CORONARY MORPH W/CTA COR W/SCORE W/CA W/CM &/OR WO/CM   Basic metabolic panel   EKG 12-Lead     Patient Instructions / Medication Changes & Studies & Tests Ordered   Patient Instructions  Medication Instructions:  See  instruction below - Metoprolol  one time dose   *If you need a refill on your cardiac medications before your next appointment, please call your pharmacy*   Lab Work: See  instruction below - BMP  If you have labs (blood work) drawn today and your tests are completely normal, you will receive your results only by: MyChart Message (if you have MyChart) OR A paper copy in the mail If you have any lab test that is abnormal or we need to change your treatment, we will call you to review the results.   Testing/Procedures: Will be schedule at [address] - Radiology  dept Your physician has requested that you have cardiac CTA. Cardiac computed tomography (CT) is a painless test that uses an x-ray machine to take clear,  detailed pictures of your heart. Please follow instruction sheet as given.     Follow-Up: At [facility], you and your health needs are our priority.  As part of our continuing mission to provide you with exceptional heart care, we have created designated Provider Care Teams.  These Care Teams include your primary Cardiologist (physician) and Advanced Practice Providers (APPs -  Physician Assistants and Nurse Practitioners) who all work together to provide you with the care you need, when you need it.     Your next appointment:   2 month(s)  The format for your next appointment:   In Person  Provider:   Bryan Lemma, MD   Other Instructions -> Instructions on coronary CTA  provided    Your cardiac CT will be scheduled at the below location:   [facility] [address] [place], [place] [address] [phone]   Bryan Lemma, M.D., M.S. Interventional Cardiologist   Pager # [phone] Phone # [phone] [address] [place], [place] [address]   Thank you for choosing Heartcare at [facility]!!

## 2020-11-17 ENCOUNTER — Ambulatory Visit (INDEPENDENT_AMBULATORY_CARE_PROVIDER_SITE_OTHER): Payer: 59 | Admitting: Cardiology

## 2020-11-17 ENCOUNTER — Other Ambulatory Visit: Payer: Self-pay

## 2020-11-17 ENCOUNTER — Encounter: Payer: Self-pay | Admitting: Cardiology

## 2020-11-17 VITALS — BP 128/60 | HR 83 | Ht 69.0 in | Wt 222.2 lb

## 2020-11-17 DIAGNOSIS — E669 Obesity, unspecified: Secondary | ICD-10-CM

## 2020-11-17 DIAGNOSIS — R072 Precordial pain: Secondary | ICD-10-CM | POA: Diagnosis not present

## 2020-11-17 DIAGNOSIS — G4733 Obstructive sleep apnea (adult) (pediatric): Secondary | ICD-10-CM

## 2020-11-17 DIAGNOSIS — E785 Hyperlipidemia, unspecified: Secondary | ICD-10-CM

## 2020-11-17 DIAGNOSIS — I10 Essential (primary) hypertension: Secondary | ICD-10-CM | POA: Diagnosis not present

## 2020-11-17 MED ORDER — METOPROLOL TARTRATE 50 MG PO TABS: 100.0000 mg | ORAL_TABLET | Freq: Once | ORAL | 0 refills | Status: DC

## 2020-11-17 NOTE — Patient Instructions (Addendum)
Medication Instructions:  See  instruction below - Metoprolol  one time dose   *If you need a refill on your cardiac medications before your next appointment, please call your pharmacy*   Lab Work: See  instruction below - BMP  If you have labs (blood work) drawn today and your tests are completely normal, you will receive your results only by: MyChart Message (if you have MyChart) OR A paper copy in the mail If you have any lab test that is abnormal or we need to change your treatment, we will call you to review the results.   Testing/Procedures: Will be schedule at [address] - Radiology  dept Your physician has requested that you have cardiac CTA. Cardiac computed tomography (CT) is a painless test that uses an x-ray machine to take clear, detailed pictures of your heart. Please follow instruction sheet as given.     Follow-Up: At [facility], you and your health needs are our priority.  As part of our continuing mission to provide you with exceptional heart care, we have created designated Provider Care Teams.  These Care Teams include your primary Cardiologist (physician) and Advanced Practice Providers (APPs -  Physician Assistants and Nurse Practitioners) who all work together to provide you with the care you need, when you need it.     Your next appointment:   2 month(s)  The format for your next appointment:   In Person  Provider:   Bryan Lemma, MD   Other Instructions     Your cardiac CT will be scheduled at the below location:   [facility] [address] [place], [place] [address] [phone]    Please arrive at the [facility] main entrance (entrance A) of [facility] 30 minutes prior to test start time. Proceed to the [facility] Radiology Department (first floor) to check-in and test prep.    Please follow these instructions carefully (unless otherwise directed):  Hold all erectile dysfunction medications  at least 3 days (72 hrs) prior to test.  On the Night Before the Test: Be sure to Drink plenty of water. Do not consume any caffeinated/decaffeinated beverages or chocolate 12 hours prior to your test. Do not take any antihistamines 12 hours prior to your test. If the patient has contrast allergy: Patient will need a prescription for Prednisone and very clear instructions (as follows): Prednisone 50 mg - take 13 hours prior to test Take another Prednisone 50 mg 7 hours prior to test Take another Prednisone 50 mg 1 hour prior to test Take Benadryl 50 mg 1 hour prior to test Patient must complete all four doses of above prophylactic medications. Patient will need a ride after test due to Benadryl.  On the Day of the Test: Drink plenty of water until 1 hour prior to the test. Do not eat any food 4 hours prior to the test. You may take your regular medications prior to the test.  Take metoprolol (Lopressor)  100 mg two hours prior to test.         After the Test: Drink plenty of water. After receiving IV contrast, you may experience a mild flushed feeling. This is normal. On occasion, you may experience a mild rash up to 24 hours after the test. This is not dangerous. If this occurs, you can take Benadryl 25 mg and increase your fluid intake. If you experience trouble breathing, this can be serious. If it is severe call 911 IMMEDIATELY. If it  is mild, please call our office.   Please allow 2-4 weeks for scheduling of routine cardiac CTs. Some insurance companies require a pre-authorization which may delay scheduling of this test.   For non-scheduling related questions, please contact the cardiac imaging nurse navigator should you have any questions/concerns: Rockwell Alexandria, Cardiac Imaging Nurse Navigator Larey Brick, Cardiac Imaging Nurse Navigator [facility] Direct Office Dial: [phone]   For scheduling needs, including cancellations and  rescheduling, please call [place], [phone].

## 2020-11-18 ENCOUNTER — Encounter: Payer: Self-pay | Admitting: Cardiology

## 2020-11-18 DIAGNOSIS — E1169 Type 2 diabetes mellitus with other specified complication: Secondary | ICD-10-CM | POA: Insufficient documentation

## 2020-11-18 DIAGNOSIS — E785 Hyperlipidemia, unspecified: Secondary | ICD-10-CM | POA: Insufficient documentation

## 2020-11-18 NOTE — Assessment & Plan Note (Signed)
Blood pressure looks pretty good on current dose of lisinopril.  Will monitor.  If there is evidence of significant CAD, would probably consider beta-blocker as well.

## 2020-11-18 NOTE — Assessment & Plan Note (Signed)
Total cholesterol 207 with decent HDL of 46, LDL 119 and triglycerides 159.  The HDL is reassuring, but triglycerides being elevated and LDL being over 100 is somewhat concerning.  Would like to risk stratify and determine how aggressive we need to be to treat.  Since he is actively having chest pain symptoms, we can evaluate both with Cardiac CTA which provides Coronary Calcium Score and plaque burden information.  Anticipate initiating statin based on results.  We just need to determine target levels.

## 2020-11-18 NOTE — Assessment & Plan Note (Signed)
Obviously we discussed importance of weight loss.  This is with diet and exercise.  In order for him to trip notable with exercise, weariness exclude ischemic CAD.  With hypertension, obesity, and TG greater than 242, he does meet criteria for metabolic syndrome.  This puts him at a higher risk for CAD and therefore we are aggressively evaluating for ischemic CAD with a Cardiac CT Angiogram.

## 2020-11-18 NOTE — Assessment & Plan Note (Signed)
Interestingly, he has this precordial chest pain which sounds M1 with respect very concerning for angina when he describes it as a heaviness and pressure.  Then he also describes a burning sharp stabbing discomfort which is different.  Also confusing is that it can happen with or without exertion.  Concerning features that he he may notice this plus exertional dyspnea going up inclines as opposed to walking on flat ground which could be consistent with stable angina.  States that his features are mixed back between typical and atypical features, will evaluate for coronary ischemia with a cardiac CT angiogram that will allow [place] to identify evidence of coronary artery disease as well as potential physiology indicating ischemic lesions.  Plan: Cardiac CTA with possible CT FFR.

## 2020-11-23 ENCOUNTER — Telehealth (HOSPITAL_COMMUNITY): Payer: Self-pay | Admitting: *Deleted

## 2020-11-23 ENCOUNTER — Encounter (HOSPITAL_COMMUNITY): Payer: Self-pay

## 2020-11-23 NOTE — Telephone Encounter (Signed)
Reaching out to patient to offer assistance regarding upcoming cardiac imaging study; pt verbalizes understanding of appt date/time, parking situation and where to check in, pre-test NPO status and medications ordered, and verified current allergies; name and call back number provided for further questions should they arise  Harold Brick RN Navigator Cardiac Imaging [facility] [phone] office [phone] cell  Patient to take 100mg  metoprolol tartrate two hours prior to cardiac CT. Per request, instructions sent to patient's MyChart.

## 2020-11-24 LAB — BASIC METABOLIC PANEL
BUN/Creatinine Ratio: 16 (ref 9–20)
BUN: 16 mg/dL (ref 6–24)
CO2: 22 mmol/L (ref 20–29)
Calcium: 9.4 mg/dL (ref 8.7–10.2)
Chloride: 101 mmol/L (ref 96–106)
Creatinine, Ser: 1 mg/dL (ref 0.76–1.27)
Glucose: 194 mg/dL — ABNORMAL HIGH (ref 70–99)
Potassium: 4.5 mmol/L (ref 3.5–5.2)
Sodium: 138 mmol/L (ref 134–144)
eGFR: 88 mL/min/{1.73_m2} (ref >59)

## 2020-11-25 ENCOUNTER — Ambulatory Visit (HOSPITAL_COMMUNITY)
Admission: RE | Admit: 2020-11-25 | Discharge: 2020-11-25 | Disposition: A | Discharge status: Home / Self Care | Payer: 59 | Source: Ambulatory Visit | Attending: Cardiology | Admitting: Cardiology

## 2020-11-25 ENCOUNTER — Other Ambulatory Visit: Payer: Self-pay

## 2020-11-25 DIAGNOSIS — R072 Precordial pain: Secondary | ICD-10-CM | POA: Insufficient documentation

## 2020-11-25 DIAGNOSIS — E669 Obesity, unspecified: Secondary | ICD-10-CM | POA: Insufficient documentation

## 2020-11-25 MED ORDER — NITROGLYCERIN 0.4 MG SL SUBL
SUBLINGUAL_TABLET | SUBLINGUAL | Status: AC
  Filled 2020-11-25: qty 2

## 2020-11-25 MED ORDER — NITROGLYCERIN 0.4 MG SL SUBL
0.8000 mg | SUBLINGUAL_TABLET | Freq: Once | SUBLINGUAL | Status: AC
  Administered 2020-11-25: 0.8 mg via SUBLINGUAL

## 2020-11-25 MED ORDER — IOHEXOL 350 MG/ML SOLN
100.0000 mL | Freq: Once | INTRAVENOUS | Status: AC | PRN
  Administered 2020-11-25: 100 mL via INTRAVENOUS

## 2020-12-04 ENCOUNTER — Telehealth: Payer: Self-pay | Admitting: *Deleted

## 2020-12-04 NOTE — Telephone Encounter (Signed)
-----   Message from Marykay Lex, MD sent at 11/25/2020  7:23 PM EDT ----- Coronary CT angiogram results:  Calcium score is 243.  Elevated but not dramatic. CT angiogram shows minimal (<25%) disease in the right coronary artery, left main and left anterior descending artery.  There is mild (25-49%) plaque in the left circumflex artery-not flow-limiting.  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  Usually not something we worry about unless there is concern for unexplained stroke, headaches or for scuba divers.  For the coronary calcium score recommendation will be to continue aggressive risk factor modification with lipid, blood pressure and blood sugar control.  For smokers smoking cessation counseling is also recommended.  Medically, there does not appear to be any obstructive heart artery disease meaning most of the remodeling is as I mentioned in clinic POSITIVE is on the outside of the artery tube.   Bryan Lemma, MD

## 2020-12-04 NOTE — Telephone Encounter (Signed)
Called left message to call back  results. Patient can review on mychart  Would like to give follow up appointment  at present time - Have 2 virtual appt available  on Dec 7 or Dec 16. If needs a in person  can do Feb 10, 2021

## 2020-12-09 NOTE — Telephone Encounter (Signed)
The patient has been notified of the result and verbalized understanding.  All questions (if any) were answered. FOLLOW UP  APPOINTMENT SCHEDULE AT 01/13/21 AT 8 AM  Tobin Chad, RN 12/09/2020 2:45 PM

## 2020-12-11 DIAGNOSIS — I2510 Atherosclerotic heart disease of native coronary artery without angina pectoris: Secondary | ICD-10-CM

## 2020-12-11 DIAGNOSIS — R911 Solitary pulmonary nodule: Secondary | ICD-10-CM | POA: Insufficient documentation

## 2020-12-11 HISTORY — DX: Atherosclerotic heart disease of native coronary artery without angina pectoris: I25.10

## 2021-01-12 NOTE — Progress Notes (Signed)
Virtual Visit via Telephone Note   This visit type was conducted due to national recommendations for restrictions regarding the COVID-19 Pandemic (e.g. social distancing) in an effort to limit this patient's exposure and mitigate transmission in our community.  Due to his co-morbid illnesses, this patient is at least at moderate risk for complications without adequate follow up.  This format is felt to be most appropriate for this patient at this time.  The patient did not have access to video technology/had technical difficulties with video requiring transitioning to audio format only (telephone).  All issues noted in this document were discussed and addressed.  No physical exam could be performed with this format.  Please refer to the patient's chart for his  consent to telehealth for [facility].   Patient has given verbal permission to conduct this visit via virtual appointment and to bill insurance 01/13/2021 7:23 PM     Patient is a long-haul truck driver, does not have access to Internet in order to be able to allow for video conferencing.  Evaluation Performed:  Follow-up visit  Date:  01/13/2021   ID:  Harold Bullock, DOB 25-Mar-1965, MRN [id number]  Patient Location: Home Provider Location: Other:  -Hospital office  PCP:  Bennie Pierini, FNP  Cardiologist:  Bryan Lemma, MD Bryan Lemma, MD  Electrophysiologist:  None   Chief Complaint:   Chief Complaint  Patient presents with   Follow-up    Test results   Chest Pain    Less frequent.  Less noticeable.  Not with exertion     ====================================  ASSESSMENT & PLAN:    Problem List Items Addressed This Visit       Cardiology Problems   Agatston CAC score 200-399 (Chronic)    With Coronary Calcium Score of 243.  He is at moderate risk for occlusive CAD/ACS.  Plan: Start Crestor at 20 mg daily recheck labs now, and then again in 3 to 4 months. Consider aspirin daily, but for now not  high enough risk. Continue ACE inhibitor.  Next choice for blood pressure management would be beta-blocker.      Relevant Medications   rosuvastatin (CRESTOR) 20 MG tablet   Other Relevant Orders   Lipid panel   Comprehensive metabolic panel   Hemoglobin A1c   Lipid panel   Hemoglobin A1c   Comprehensive metabolic panel   PFO (patent foramen ovale)    Possible PFO noted on Coronary CTA.   We did not discuss this today, can discuss in follow-up.  Plan will be to schedule 2D echo with bubble study.      Relevant Medications   rosuvastatin (CRESTOR) 20 MG tablet   Essential hypertension (Chronic)    Blood pressure was pretty well controlled last visit on lisinopril.  If additional control is required, would consider beta-blocker as next option for Therapy based on CAD.      Relevant Medications   rosuvastatin (CRESTOR) 20 MG tablet   Other Relevant Orders   Comprehensive metabolic panel   Lipid panel   Comprehensive metabolic panel   Coronary artery disease, non-occlusive - Primary (Chronic)    His chest pain is probably not anginal in nature based lymphectomy does not have obstructive CAD on Coronary CTA.  Study actually shows that he does have nonobstructive plaque, therefore with metabolic syndrome, aggressive risk factor modification is warranted.  Plan: Starting statin, would likely if symptoms recur. He is on ACE inhibitor, will DC if blood pressure is, in future consider beta-blocker.  Smoking cessation counseling      Relevant Medications   rosuvastatin (CRESTOR) 20 MG tablet   Hyperlipidemia with target LDL less than 70 (Chronic)    Triglycerides elevated.  Discussed dietary modifications.  LDL 119 with goal less than 70.  Plan: Recheck labs for new baseline, start statin and check labs again in 3 months.      Relevant Medications   rosuvastatin (CRESTOR) 20 MG tablet   Other Relevant Orders   Lipid panel   Comprehensive metabolic panel   Hemoglobin A1c    Lipid panel   Hemoglobin A1c   Comprehensive metabolic panel     Other   Pulmonary nodule, right (Chronic)    Pulmonary nodule noted on CT.  Based on his risk factors of smoking, would recommend noncontrast CT scan in 1 year.  If not ordered by PCP, we can order at next visit.  (Would be due in November of next year).      Hyperglycemia   Relevant Orders   Lipid panel   Comprehensive metabolic panel   Hemoglobin A1c   Lipid panel   Hemoglobin A1c   Comprehensive metabolic panel   OSA (obstructive sleep apnea) (Chronic)   Relevant Orders   Comprehensive metabolic panel   Lipid panel   Comprehensive metabolic panel   Obesity (BMI 30.0-34.9) (Chronic)    Discussed importance of dietary modifications and increasing exercise level. Especially with his job being somewhat sedentary, he needs to try exercise least at night.  Also discussed importance of dietary modification.  Provided diet information/dietary counseling..      Relevant Orders   Lipid panel   Comprehensive metabolic panel   Hemoglobin A1c   Lipid panel   Hemoglobin A1c   Comprehensive metabolic panel   ====================================  History of Present Illness:    Harold Bullock is an obese 55 y.o. male-long-term smoker with PMH notable for HTN, HLD, OSA (on CPAP) who presents via audio/telephone conferencing for a telehealth visit today as a 35-month follow-up to discuss results of Coronary CTA to evaluate intermittent/occasional chest pain.Harold Bullock was last seen November 17, 2020 for the consultation at the request of [place], FNP for evaluation of occasional chest pain.  It comes and goes at random times has some days with several episodes and other days without.  Some concerning features but otherwise other atypical features.  Described as a heavy sensation associate with dyspnea.  However may or may not occur with exertion.  Episodes last maybe 20 to 30 minutes and can happen as  many as to 3 times a day but then can go several days without any. Coronary CTA ordered for ischemic evaluation.  Hospitalizations:  None   Recent - Interim CV studies:   The following studies were reviewed today: Coronary CTA 12/11/2020: Coronary calcium score 243.  Minimal-mild nonobstructive CAD (CAD RADS 2.  Mid LAD bridging segment noted.  Minimal (<24%) proximal RCA and left main.  Minimal proximal LAD and small D1.  Mild(25-49%) proximal LCx.  Several OM branches without significant disease.  PFO with left-to-right shunting suspected.  Aortic root and aortic atherosclerosis. Noncardiac findings 3 mm subpleural anterior right upper lobe nodule.  No follow-up required if low risk.  If high risk, consider 36-month follow-up noncontrast study.  Inerval History   Harold Bullock tells me that his chest pain episodes have notably improved.  He only has symptoms every now and then and are not at all associated with exertion.  Because of his job requirements, he is not able to exercise routinely on a daily basis working intensely from 7 AM to 7 PM most days of the week driving.  He does however go on long hikes with his dog on the weekends usually about a second half mile [place] up and down hills 1" a mile which is climbing stairs on the side of the mountain.  He is able to do all these activities on a weekly basis without having any chest pain or pressure.  Cardiovascular ROS: positive for - chest pain, dyspnea on exertion, and  -occasional chest pain that is not exertional negative for - edema, irregular heartbeat, orthopnea, palpitations, paroxysmal nocturnal dyspnea, rapid heart rate, shortness of breath, or lightheadedness and dizziness (unless he is dehydrated), syncope/near syncope or TIA/amaurosis fugax, claudication    ROS:  Please see the history of present illness.     Review of Systems  Constitutional:  Negative for malaise/fatigue (Only if he does not get enough sleep).  HENT:   Negative for congestion.   Gastrointestinal:  Negative for abdominal pain, blood in stool, melena, nausea and vomiting.  Genitourinary:  Negative for hematuria.  Musculoskeletal:  Positive for joint pain. Negative for myalgias.  Neurological:  Negative for headaches.  Psychiatric/Behavioral:  Negative for depression. The patient is not nervous/anxious.    Past Medical History:  Diagnosis Date   Coronary artery disease, non-occlusive 12/11/2020   Coronary calcium score 243.  Minimal-mild nonobstructive CAD (CAD RADS 2.  Mid LAD bridging segment noted.  Minimal (<24%) proximal RCA and left main.  Minimal proximal LAD and small D1.  Mild(25-49%) proximal LCx.  Several OM branches without significant disease.   Essential hypertension    Hyperlipidemia due to dietary fat intake 09/08/2020   TC 207, TG 242, HDL 46, LDL 119.   Metabolic syndrome    Obesity, hypertension, hypertriglyceridemia.   Obese    OSA (obstructive sleep apnea) 05/21/2015   Past Surgical History:  Procedure Laterality Date   BACK SURGERY  2005      Current Meds  Medication Sig   lisinopril (ZESTRIL) 20 MG tablet Take 1 tablet (20 mg total) by mouth daily.   rosuvastatin (CRESTOR) 20 MG tablet Take 1 tablet (20 mg total) by mouth daily.     Allergies:   Patient has no known allergies.   Social History   Tobacco Use   Smoking status: Every Day    Packs/day: 1.00    Years: 20.00    Pack years: 20.00    Types: Cigarettes    Last attempt to quit: 05/27/2016    Years since quitting: 4.6   Smokeless tobacco: Never  Vaping Use   Vaping Use: Never used  Substance Use Topics   Alcohol use: Yes    Alcohol/week: 0.0 standard drinks    Comment: 6 beers/daily   Drug use: No     Family Hx: The patient's family history includes Alzheimer's disease in his mother; Cancer in his maternal grandfather, maternal uncle, and paternal grandmother.   Labs/Other Tests and Data Reviewed:    EKG:  No ECG reviewed.  Recent  Labs: 09/08/2020: ALT 21; Hemoglobin 15.0; Platelets 202 11/23/2020: BUN 16; Creatinine, Ser 1.00; Potassium 4.5; Sodium 138   Recent Lipid Panel Lab Results  Component Value Date/Time   CHOL 207 (H) 09/08/2020 11:26 AM   TRIG 242 (H) 09/08/2020 11:26 AM   HDL 46 09/08/2020 11:26 AM   CHOLHDL 4.5 09/08/2020 11:26 AM   LDLCALC 119 (  H) 09/08/2020 11:26 AM    Wt Readings from Last 3 Encounters:  11/17/20 222 lb 3.2 oz (100.8 kg)  10/06/20 220 lb (99.8 kg)  09/08/20 220 lb 12.8 oz (100.2 kg)     Objective:    Vital Signs:  Ht 5\' 9"  (1.753 m)   BMI 32.81 kg/m   VITAL SIGNS:  reviewed GEN:  no acute distress RESPIRATORY:  -Nonlabored NEURO:  A&O x 3. Responds to ?s appropriately.  Normal thought content. PSYCH:  normal mood and affect.   ==========================================  COVID-19 Education: The signs and symptoms of COVID-19 were discussed with the patient and how to seek care for testing (follow up with PCP or arrange E-visit).   The importance of social distancing was discussed today.  Time:   Today, I have spent 16 min with the patient with telehealth technology discussing the above problems.   An additional 10 min (reviewing prior notes, hospital records, studies, labs etc.) Total 71minutes   Medication Adjustments/Labs and Tests Ordered: Current medicines are reviewed at length with the patient today.  Concerns regarding medicines are outlined above.   Patient Instructions  Medication Instructions:   Rosuvastatin 20 mg p.o. daily; disp#90 tabs, 3 refills  *If you need a refill on your cardiac medications before your next appointment, please call your pharmacy*   Lab Work:  This month: CMP, lipid panel, A1c 4 months out: Repeat labs  If you have labs (blood work) drawn today and your tests are completely normal, you will receive your results only by: [place] (if you have MyChart) OR A paper copy in the mail If you have any lab test that is  abnormal or we need to change your treatment, we will call you to review the results.   Testing/Procedures: None   Follow-Up: At [facility], you and your health needs are our priority.  As part of our continuing mission to provide you with exceptional heart care, we have created designated Provider Care Teams.  These Care Teams include your primary Cardiologist (physician) and Advanced Practice Providers (APPs -  Physician Assistants and Nurse Practitioners) who all work together to provide you with the care you need, when you need it.     Your next appointment:   6 month(s)  The format for your next appointment:   In Person  Provider:   Glenetta Hew, MD     Other Instructions Continue to stay active as possible.  Try to walk at least 15 to 20 minutes, but certainly continue to walk the dog on the weekends Try to monitor when she is eating.  Cut down on processed foods, and foods high in fat and cholesterol.  (Provide Salty 6 & DASH diet information)   Signed, Glenetta Hew, MD  01/13/2021 7:23 PM    [facility]

## 2021-01-13 ENCOUNTER — Telehealth (INDEPENDENT_AMBULATORY_CARE_PROVIDER_SITE_OTHER): Payer: 59 | Admitting: Cardiology

## 2021-01-13 ENCOUNTER — Encounter: Payer: Self-pay | Admitting: Cardiology

## 2021-01-13 ENCOUNTER — Telehealth: Payer: Self-pay | Admitting: *Deleted

## 2021-01-13 ENCOUNTER — Other Ambulatory Visit: Payer: Self-pay

## 2021-01-13 VITALS — Ht 69.0 in

## 2021-01-13 DIAGNOSIS — G4733 Obstructive sleep apnea (adult) (pediatric): Secondary | ICD-10-CM | POA: Diagnosis not present

## 2021-01-13 DIAGNOSIS — E1169 Type 2 diabetes mellitus with other specified complication: Secondary | ICD-10-CM | POA: Insufficient documentation

## 2021-01-13 DIAGNOSIS — I2510 Atherosclerotic heart disease of native coronary artery without angina pectoris: Secondary | ICD-10-CM | POA: Diagnosis not present

## 2021-01-13 DIAGNOSIS — Q2112 Patent foramen ovale: Secondary | ICD-10-CM | POA: Insufficient documentation

## 2021-01-13 DIAGNOSIS — E785 Hyperlipidemia, unspecified: Secondary | ICD-10-CM

## 2021-01-13 DIAGNOSIS — R911 Solitary pulmonary nodule: Secondary | ICD-10-CM

## 2021-01-13 DIAGNOSIS — E669 Obesity, unspecified: Secondary | ICD-10-CM

## 2021-01-13 DIAGNOSIS — I10 Essential (primary) hypertension: Secondary | ICD-10-CM | POA: Diagnosis not present

## 2021-01-13 DIAGNOSIS — R931 Abnormal findings on diagnostic imaging of heart and coronary circulation: Secondary | ICD-10-CM | POA: Insufficient documentation

## 2021-01-13 DIAGNOSIS — E119 Type 2 diabetes mellitus without complications: Secondary | ICD-10-CM | POA: Insufficient documentation

## 2021-01-13 DIAGNOSIS — R739 Hyperglycemia, unspecified: Secondary | ICD-10-CM

## 2021-01-13 MED ORDER — ROSUVASTATIN CALCIUM 20 MG PO TABS: 20.0000 mg | ORAL_TABLET | Freq: Every day | ORAL | 3 refills | Status: DC

## 2021-01-13 NOTE — Assessment & Plan Note (Signed)
Discussed importance of dietary modifications and increasing exercise level. Especially with his job being somewhat sedentary, he needs to try exercise least at night.  Also discussed importance of dietary modification.  Provided diet information/dietary counseling.Marland Kitchen

## 2021-01-13 NOTE — Assessment & Plan Note (Addendum)
Triglycerides elevated.  Discussed dietary modifications.  LDL 119 with goal less than 70.  Plan: Recheck labs for new baseline, start statin and check labs again in 3 months.

## 2021-01-13 NOTE — Assessment & Plan Note (Addendum)
His chest pain is probably not anginal in nature based lymphectomy does not have obstructive CAD on Coronary CTA.  Study actually shows that he does have nonobstructive plaque, therefore with metabolic syndrome, aggressive risk factor modification is warranted.  Plan: Starting statin, would likely if symptoms recur.  He is on ACE inhibitor, will DC if blood pressure is, in future consider beta-blocker.  Smoking cessation counseling

## 2021-01-13 NOTE — Telephone Encounter (Signed)
RN spoke to patient. Instruction were given  from today's virtual visit 01/13/21 .  AVS SUMMARY has been sent by [facility]  and mailed. LABS SLIP MAILED    FOLLOW UP  IN 6 MONTH SCHEDULE 07/28/21 at 4 pm   Patient verbalized understanding

## 2021-01-13 NOTE — Patient Instructions (Addendum)
Medication Instructions:   Rosuvastatin 20 mg p.o. daily; disp#90 tabs, 3 refills  *If you need a refill on your cardiac medications before your next appointment, please call your pharmacy*   Lab Work:  This month: CMP, lipid panel, A1c 4 months out: Repeat labs  If you have labs (blood work) drawn today and your tests are completely normal, you will receive your results only by: MyChart Message (if you have MyChart) OR A paper copy in the mail If you have any lab test that is abnormal or we need to change your treatment, we will call you to review the results.   Testing/Procedures: None   Follow-Up: At [facility], you and your health needs are our priority.  As part of our continuing mission to provide you with exceptional heart care, we have created designated Provider Care Teams.  These Care Teams include your primary Cardiologist (physician) and Advanced Practice Providers (APPs -  Physician Assistants and Nurse Practitioners) who all work together to provide you with the care you need, when you need it.     Your next appointment:   6 month(s)  The format for your next appointment:   In Person  Provider:   Bryan Lemma, MD     Other Instructions Continue to stay active as possible.  Try to walk at least 15 to 20 minutes, but certainly continue to walk the dog on the weekends Try to monitor when she is eating.  Cut down on processed foods, and foods high in fat and cholesterol.  (Provide Salty 6 & DASH diet information)

## 2021-01-13 NOTE — Assessment & Plan Note (Signed)
With Coronary Calcium Score of 243.  He is at moderate risk for occlusive CAD/ACS.  Plan:  Start Crestor at 20 mg daily recheck labs now, and then again in 3 to 4 months.  Consider aspirin daily, but for now not high enough risk.  Continue ACE inhibitor.  Next choice for blood pressure management would be beta-blocker.

## 2021-01-13 NOTE — Telephone Encounter (Signed)
  Patient Consent for Virtual Visit        Harold Bullock has provided verbal consent on 01/13/2021 for a virtual visit (video or telephone).   CONSENT FOR VIRTUAL VISIT FOR:  Harold Bullock  By participating in this virtual visit I agree to the following:  I hereby voluntarily request, consent and authorize [facility] and its employed or contracted physicians, physician assistants, nurse practitioners or other licensed health care professionals (the Practitioner), to provide me with telemedicine health care services (the "Services") as deemed necessary by the treating Practitioner. I acknowledge and consent to receive the Services by the Practitioner via telemedicine. I understand that the telemedicine visit will involve communicating with the Practitioner through live audiovisual communication technology and the disclosure of certain medical information by electronic transmission. I acknowledge that I have been given the opportunity to request an in-person assessment or other available alternative prior to the telemedicine visit and am voluntarily participating in the telemedicine visit.  I understand that I have the right to withhold or withdraw my consent to the use of telemedicine in the course of my care at any time, without affecting my right to future care or treatment, and that the Practitioner or I may terminate the telemedicine visit at any time. I understand that I have the right to inspect all information obtained and/or recorded in the course of the telemedicine visit and may receive copies of available information for a reasonable fee.  I understand that some of the potential risks of receiving the Services via telemedicine include:  Delay or interruption in medical evaluation due to technological equipment failure or disruption; Information transmitted may not be sufficient (e.g. poor resolution of images) to allow for appropriate medical decision making by the Practitioner;  and/or  In rare instances, security protocols could fail, causing a breach of personal health information.  Furthermore, I acknowledge that it is my responsibility to provide information about my medical history, conditions and care that is complete and accurate to the best of my ability. I acknowledge that Practitioner's advice, recommendations, and/or decision may be based on factors not within their control, such as incomplete or inaccurate data provided by me or distortions of diagnostic images or specimens that may result from electronic transmissions. I understand that the practice of medicine is not an exact science and that Practitioner makes no warranties or guarantees regarding treatment outcomes. I acknowledge that a copy of this consent can be made available to me via my patient portal [facility] MyChart), or I can request a printed copy by calling the office of [facility].    I understand that my insurance will be billed for this visit.   I have read or had this consent read to me. I understand the contents of this consent, which adequately explains the benefits and risks of the Services being provided via telemedicine.  I have been provided ample opportunity to ask questions regarding this consent and the Services and have had my questions answered to my satisfaction. I give my informed consent for the services to be provided through the use of telemedicine in my medical care

## 2021-01-13 NOTE — Telephone Encounter (Signed)
First attempt to contact patient for virtual visit

## 2021-01-13 NOTE — Assessment & Plan Note (Addendum)
Pulmonary nodule noted on CT.  Based on his risk factors of smoking, would recommend noncontrast CT scan in 1 year.  If not ordered by PCP, we can order at next visit.  (Would be due in November of next year).

## 2021-01-13 NOTE — Assessment & Plan Note (Signed)
Blood pressure was pretty well controlled last visit on lisinopril.  If additional control is required, would consider beta-blocker as next option for Therapy based on CAD.

## 2021-01-13 NOTE — Assessment & Plan Note (Signed)
Possible PFO noted on Coronary CTA.   We did not discuss this today, can discuss in follow-up.  Plan will be to schedule 2D echo with bubble study.

## 2021-01-21 ENCOUNTER — Ambulatory Visit (INDEPENDENT_AMBULATORY_CARE_PROVIDER_SITE_OTHER): Payer: 59 | Admitting: Nurse Practitioner

## 2021-01-21 ENCOUNTER — Encounter: Payer: Self-pay | Admitting: Nurse Practitioner

## 2021-01-21 VITALS — BP 127/76 | HR 83 | Temp 98.1°F | Resp 20 | Ht 69.0 in | Wt 219.0 lb

## 2021-01-21 DIAGNOSIS — I10 Essential (primary) hypertension: Secondary | ICD-10-CM

## 2021-01-21 DIAGNOSIS — R739 Hyperglycemia, unspecified: Secondary | ICD-10-CM | POA: Diagnosis not present

## 2021-01-21 DIAGNOSIS — E1165 Type 2 diabetes mellitus with hyperglycemia: Secondary | ICD-10-CM | POA: Diagnosis not present

## 2021-01-21 DIAGNOSIS — E785 Hyperlipidemia, unspecified: Secondary | ICD-10-CM | POA: Diagnosis not present

## 2021-01-21 DIAGNOSIS — E669 Obesity, unspecified: Secondary | ICD-10-CM

## 2021-01-21 LAB — LIPID PANEL
Chol/HDL Ratio: 5 ratio (ref 0.0–5.0)
Cholesterol, Total: 213 mg/dL — ABNORMAL HIGH (ref 100–199)
HDL: 43 mg/dL (ref >39)
LDL Chol Calc (NIH): 142 mg/dL — ABNORMAL HIGH (ref 0–99)
Triglycerides: 154 mg/dL — ABNORMAL HIGH (ref 0–149)
VLDL Cholesterol Cal: 28 mg/dL (ref 5–40)

## 2021-01-21 LAB — CMP14+EGFR
ALT: 29 IU/L (ref 0–44)
AST: 21 IU/L (ref 0–40)
Albumin/Globulin Ratio: 2 (ref 1.2–2.2)
Albumin: 4.7 g/dL (ref 3.8–4.9)
Alkaline Phosphatase: 95 IU/L (ref 44–121)
BUN/Creatinine Ratio: 13 (ref 9–20)
BUN: 13 mg/dL (ref 6–24)
Bilirubin Total: 0.4 mg/dL (ref 0.0–1.2)
CO2: 23 mmol/L (ref 20–29)
Calcium: 9.4 mg/dL (ref 8.7–10.2)
Chloride: 99 mmol/L (ref 96–106)
Creatinine, Ser: 0.97 mg/dL (ref 0.76–1.27)
Globulin, Total: 2.3 g/dL (ref 1.5–4.5)
Glucose: 127 mg/dL — ABNORMAL HIGH (ref 70–99)
Potassium: 4.7 mmol/L (ref 3.5–5.2)
Sodium: 135 mmol/L (ref 134–144)
Total Protein: 7 g/dL (ref 6.0–8.5)
eGFR: 92 mL/min/{1.73_m2} (ref >59)

## 2021-01-21 LAB — CBC WITH DIFFERENTIAL/PLATELET
Basophils Absolute: 0 10*3/uL (ref 0.0–0.2)
Basos: 1 %
EOS (ABSOLUTE): 0.1 10*3/uL (ref 0.0–0.4)
Eos: 3 %
Hematocrit: 44.9 % (ref 37.5–51.0)
Hemoglobin: 15.5 g/dL (ref 13.0–17.7)
Immature Grans (Abs): 0.1 10*3/uL (ref 0.0–0.1)
Immature Granulocytes: 1 %
Lymphocytes Absolute: 1 10*3/uL (ref 0.7–3.1)
Lymphs: 21 %
MCH: 34.1 pg — ABNORMAL HIGH (ref 26.6–33.0)
MCHC: 34.5 g/dL (ref 31.5–35.7)
MCV: 99 fL — ABNORMAL HIGH (ref 79–97)
Monocytes Absolute: 0.5 10*3/uL (ref 0.1–0.9)
Monocytes: 11 %
Neutrophils Absolute: 3.1 10*3/uL (ref 1.4–7.0)
Neutrophils: 63 %
Platelets: 193 10*3/uL (ref 150–450)
RBC: 4.55 x10E6/uL (ref 4.14–5.80)
RDW: 11.8 % (ref 11.6–15.4)
WBC: 4.8 10*3/uL (ref 3.4–10.8)

## 2021-01-21 LAB — BAYER DCA HB A1C WAIVED: HB A1C (BAYER DCA - WAIVED): 6.4 % — ABNORMAL HIGH (ref 4.8–5.6)

## 2021-01-21 MED ORDER — OZEMPIC (0.25 OR 0.5 MG/DOSE) 2 MG/1.5ML ~~LOC~~ SOPN: 0.5000 mg | PEN_INJECTOR | SUBCUTANEOUS | 5 refills | Status: DC

## 2021-01-21 MED ORDER — LISINOPRIL 20 MG PO TABS: 20.0000 mg | ORAL_TABLET | Freq: Every day | ORAL | 1 refills | Status: DC

## 2021-01-21 MED ORDER — BLOOD GLUCOSE METER KIT: PACK | 0 refills | Status: DC

## 2021-01-21 NOTE — Progress Notes (Signed)
Subjective:    Patient ID: Harold Bullock, male    DOB: Jan 22, 1966, 55 y.o.   MRN: [id number]  Chief Complaint: medical management of chronic issues     HPI:  1. Primary hypertension No c/o chest [ain, sob or headache. Does not check blood pressure at home very often. BP Readings from Last 3 Encounters:  11/25/20 (!) 127/57  11/17/20 128/60  10/06/20 121/69     2. Elevated blood sugar Fasting blood sugars have been elevated. There were orders for him to come in and have HAGB1c done but he never came in and had done. He has been checking his blood sugars at home and they have been running high. Is cardiologist insisted that he come in for fasting labs. He does not check his blood sugars at home.  3. Hyperlipidemia with target LDL less than 70 He does not watch his diet very closely. He does stay very active. He is starting on crestor today. Lab Results  Component Value Date   CHOL 207 (H) 09/08/2020   HDL 46 09/08/2020   LDLCALC 119 (H) 09/08/2020   TRIG 242 (H) 09/08/2020   CHOLHDL 4.5 09/08/2020     4. Obesity (BMI 30.0-34.9) Weight is down 3bs form previous Wt Readings from Last 3 Encounters:  01/21/21 219 lb (99.3 kg)  11/17/20 222 lb 3.2 oz (100.8 kg)  10/06/20 220 lb (99.8 kg)   BMI Readings from Last 3 Encounters:  01/21/21 32.34 kg/m  01/13/21 32.81 kg/m  11/17/20 32.81 kg/m      Outpatient Encounter Medications as of 01/21/2021  Medication Sig   lisinopril (ZESTRIL) 20 MG tablet Take 1 tablet (20 mg total) by mouth daily.   rosuvastatin (CRESTOR) 20 MG tablet Take 1 tablet (20 mg total) by mouth daily.   No facility-administered encounter medications on file as of 01/21/2021.    Past Surgical History:  Procedure Laterality Date   BACK SURGERY  2005     Family History  Problem Relation Age of Onset   Alzheimer's disease Mother    Cancer Maternal Grandfather    Cancer Maternal Uncle    Cancer Paternal Grandmother     New  complaints: None today  Social history: Lives with wife  Controlled substance contract: n/a     Review of Systems  Constitutional:  Negative for diaphoresis.  Eyes:  Negative for pain.  Respiratory:  Negative for shortness of breath.   Cardiovascular:  Negative for chest pain, palpitations and leg swelling.  Gastrointestinal:  Negative for abdominal pain.  Endocrine: Negative for polydipsia.  Skin:  Negative for rash.  Neurological:  Negative for dizziness, weakness and headaches.  Hematological:  Does not bruise/bleed easily.  All other systems reviewed and are negative.     Objective:   Physical Exam Vitals and nursing note reviewed.  Constitutional:      Appearance: Normal appearance. He is well-developed.  HENT:     Head: Normocephalic.     Nose: Nose normal.     Mouth/Throat:     Mouth: Mucous membranes are moist.     Pharynx: Oropharynx is clear.  Eyes:     Pupils: Pupils are equal, round, and reactive to light.  Neck:     Thyroid: No thyroid mass or thyromegaly.     Vascular: No carotid bruit or JVD.     Trachea: Phonation normal.  Cardiovascular:     Rate and Rhythm: Normal rate and regular rhythm.  Pulmonary:     Effort: Pulmonary  effort is normal. No respiratory distress.     Breath sounds: Normal breath sounds.  Abdominal:     General: Bowel sounds are normal.     Palpations: Abdomen is soft.     Tenderness: There is no abdominal tenderness.  Musculoskeletal:        General: Normal range of motion.     Cervical back: Normal range of motion and neck supple.  Lymphadenopathy:     Cervical: No cervical adenopathy.  Skin:    General: Skin is warm and dry.  Neurological:     Mental Status: He is alert and oriented to person, place, and time.  Psychiatric:        Behavior: Behavior normal.        Thought Content: Thought content normal.        Judgment: Judgment normal.    BP 127/76    Pulse 83    Temp 98.1 F (36.7 C) (Temporal)    Resp 20    Ht  '5\' 9"'  (1.753 m)    Wt 219 lb (99.3 kg)    SpO2 97%    BMI 32.34 kg/m   HGBA1c 6.4%      Assessment & Plan:  MILES LEYDA comes in today with chief complaint of Hyperglycemia   Diagnosis and orders addressed:  1. Primary hypertension Low sodium diet - CBC with Differential/Platelet - CMP14+EGFR  2. Elevated blood sugar - Bayer DCA Hb A1c Waived - Lipid panel  3. Hyperlipidemia with target LDL less than 70 Low fat diet  4. Type 2 diabetes mellitus with hyperglycemia, without long-term current use of insulin ([place]) Ozempic started today- first shot given in office Low carb diet Keep diary of blood sugars  5. Obesity (BMI 30.0-34.9) Discussed diet and exercise for person with BMI >25 Will recheck weight in 3-6 months    Labs pending Health Maintenance reviewed Diet and exercise encouraged  Follow up plan: 3 months   [name] Hassell Done, FNP

## 2021-01-21 NOTE — Patient Instructions (Signed)

## 2021-01-26 ENCOUNTER — Telehealth: Payer: Self-pay | Admitting: Pharmacist

## 2021-01-26 DIAGNOSIS — E1165 Type 2 diabetes mellitus with hyperglycemia: Secondary | ICD-10-CM

## 2021-01-26 NOTE — Telephone Encounter (Signed)
approved  Patient and pharmacy aware

## 2021-02-17 ENCOUNTER — Other Ambulatory Visit: Payer: Self-pay | Admitting: Nurse Practitioner

## 2021-03-03 ENCOUNTER — Other Ambulatory Visit: Payer: Self-pay | Admitting: Nurse Practitioner

## 2021-03-03 NOTE — Telephone Encounter (Signed)
NEW PA needed for OZEMPIC (0.25 or 0.5 MG/DOSE) 2MG /1.5ML pen-injectors Key:  Wait for Determination Please wait for [employer] NCPDP [id number] to return a determination

## 2021-03-03 NOTE — Telephone Encounter (Signed)
Message from Plan Your PA request has been approved. Additional information will be provided in the approval communication. (Message [id number]) Dates are 03/03/2021-03/02/2024  [facility] aware

## 2021-04-12 ENCOUNTER — Ambulatory Visit: Payer: 59 | Admitting: Nurse Practitioner

## 2021-04-23 ENCOUNTER — Ambulatory Visit (INDEPENDENT_AMBULATORY_CARE_PROVIDER_SITE_OTHER): Payer: BC Managed Care – PPO | Admitting: Nurse Practitioner

## 2021-04-23 ENCOUNTER — Ambulatory Visit: Payer: 59 | Admitting: Nurse Practitioner

## 2021-04-23 ENCOUNTER — Encounter: Payer: Self-pay | Admitting: Nurse Practitioner

## 2021-04-23 VITALS — BP 144/76 | HR 81 | Temp 98.6°F | Resp 20 | Ht 69.0 in | Wt 221.0 lb

## 2021-04-23 DIAGNOSIS — E1165 Type 2 diabetes mellitus with hyperglycemia: Secondary | ICD-10-CM | POA: Diagnosis not present

## 2021-04-23 DIAGNOSIS — I10 Essential (primary) hypertension: Secondary | ICD-10-CM

## 2021-04-23 DIAGNOSIS — E66811 Obesity, class 1: Secondary | ICD-10-CM

## 2021-04-23 DIAGNOSIS — R911 Solitary pulmonary nodule: Secondary | ICD-10-CM

## 2021-04-23 DIAGNOSIS — E669 Obesity, unspecified: Secondary | ICD-10-CM

## 2021-04-23 DIAGNOSIS — E785 Hyperlipidemia, unspecified: Secondary | ICD-10-CM

## 2021-04-23 DIAGNOSIS — Q2112 Patent foramen ovale: Secondary | ICD-10-CM

## 2021-04-23 DIAGNOSIS — Z125 Encounter for screening for malignant neoplasm of prostate: Secondary | ICD-10-CM

## 2021-04-23 DIAGNOSIS — I2510 Atherosclerotic heart disease of native coronary artery without angina pectoris: Secondary | ICD-10-CM

## 2021-04-23 DIAGNOSIS — M5442 Lumbago with sciatica, left side: Secondary | ICD-10-CM

## 2021-04-23 DIAGNOSIS — G4733 Obstructive sleep apnea (adult) (pediatric): Secondary | ICD-10-CM

## 2021-04-23 LAB — BAYER DCA HB A1C WAIVED: HB A1C (BAYER DCA - WAIVED): 5.7 % — ABNORMAL HIGH (ref 4.8–5.6)

## 2021-04-23 MED ORDER — PREDNISONE 20 MG PO TABS: 40.0000 mg | ORAL_TABLET | Freq: Every day | ORAL | 0 refills | Status: AC

## 2021-04-23 MED ORDER — OZEMPIC (0.25 OR 0.5 MG/DOSE) 2 MG/1.5ML ~~LOC~~ SOPN: 0.5000 mg | PEN_INJECTOR | SUBCUTANEOUS | 5 refills | Status: DC

## 2021-04-23 MED ORDER — ROSUVASTATIN CALCIUM 20 MG PO TABS: 20.0000 mg | ORAL_TABLET | Freq: Every day | ORAL | 3 refills | Status: DC

## 2021-04-23 MED ORDER — LISINOPRIL 20 MG PO TABS: 20.0000 mg | ORAL_TABLET | Freq: Every day | ORAL | 1 refills | Status: DC

## 2021-04-23 NOTE — Progress Notes (Signed)
? ?Subjective:  ? ? Patient ID: Harold Bullock, male    DOB: 10-13-1965, 56 y.o.   MRN: [id number] ? ?Chief Complaint: No chief complaint on file. ?  ? ?HPI: ? ?Harold Bullock is a 56 y.o. who identifies as a male who was assigned male at birth.  ? ?Social history: ?Lives with: wife ?Work history: [employer] ? ? ?Comes in today for follow up of the following chronic medical issues: ? ?1. Type 2 diabetes mellitus with hyperglycemia, without long-term current use of insulin ([place]) ?He does not check his blood sugars at home. No low blood sugars. ?Lab Results  ?Component Value Date  ? HGBA1C 6.4 (H) 01/21/2021  ? ? ? ?2. Primary hypertension ?No c/o chest pain, sob or headache. He doe nsot check his bloodpressure at home very often. ?BP Readings from Last 3 Encounters:  ?04/23/21 (!) 144/76  ?01/21/21 127/76  ?11/25/20 (!) 127/57  ? ? ? ?3. Screening for prostate cancer ?Lab Results  ?Component Value Date  ? PSA1 0.5 02/13/2017  ? PSA1 0.4 01/05/2015  ? ? ? ? ?4. PFO (patent foramen ovale) ?Sees cardiology every 6 months. No chencg in [place] f care with last visit. ? ?5. Coronary artery disease, non-occlusive ?Again sees cardiology every 6 months ? ?6. OSA (obstructive sleep apnea) ?Wears CPAP nightly nad feels well rested in mornings. ? ?7. Hyperlipidemia with target LDL less than 70 ?Does try to watch diet but does very little exercise. ?Lab Results  ?Component Value Date  ? CHOL 213 (H) 01/21/2021  ? HDL 43 01/21/2021  ? LDLCALC 142 (H) 01/21/2021  ? TRIG 154 (H) 01/21/2021  ? CHOLHDL 5.0 01/21/2021  ? ? ? ?8. Pulmonary nodule, right ?Needs chest CT between now and September. He is a 2pack a day smoker. ? ?9. Obesity (BMI 30.0-34.9) ?No recent weight changes ?Wt Readings from Last 3 Encounters:  ?04/23/21 221 lb (100.2 kg)  ?01/21/21 219 lb (99.3 kg)  ?11/17/20 222 lb 3.2 oz (100.8 kg)  ? ?BMI Readings from Last 3 Encounters:  ?04/23/21 32.64 kg/m?  ?01/21/21 32.34 kg/m?  ?01/13/21 32.81 kg/m?   ? ? ? ? ?New complaints: ?Left sided low back pain for 2-3 weeks. Pain will radiate down his left leg at times ? ?No Known Allergies ?Outpatient Encounter Medications as of 04/23/2021  ?Medication Sig  ? Blood Glucose Monitoring Suppl (ONETOUCH VERIO REFLECT) w/Device KIT Test BS up to 4 times daily Dx R73.9  ? glucose blood (ONETOUCH VERIO) test strip Use up to four times a day as directed Dx R73.9  ? Lancets (ONETOUCH DELICA PLUS ZYSAYT01S) MISC Test BS up to 4 times daily Dx R73.9  ? lisinopril (ZESTRIL) 20 MG tablet Take 1 tablet (20 mg total) by mouth daily.  ? rosuvastatin (CRESTOR) 20 MG tablet Take 1 tablet (20 mg total) by mouth daily.  ? Semaglutide,0.25 or 0.5MG/DOS, (OZEMPIC, 0.25 OR 0.5 MG/DOSE,) 2 MG/1.5ML SOPN Inject 0.5 mg into the skin once a week.  ? ?No facility-administered encounter medications on file as of 04/23/2021.  ? ? ?Past Surgical History:  ?Procedure Laterality Date  ? BACK SURGERY  2005   ? ? ?Family History  ?Problem Relation Age of Onset  ? Alzheimer's disease Mother   ? Cancer Maternal Grandfather   ? Cancer Maternal Uncle   ? Cancer Paternal Grandmother   ? ? ? ? ?Controlled substance contract: n/a ? ? ? ? ?Review of Systems  ?Constitutional:  Negative for diaphoresis.  ?  Eyes:  Negative for pain.  ?Respiratory:  Negative for shortness of breath.   ?Cardiovascular:  Negative for chest pain, palpitations and leg swelling.  ?Gastrointestinal:  Negative for abdominal pain.  ?Endocrine: Negative for polydipsia.  ?Skin:  Negative for rash.  ?Neurological:  Negative for dizziness, weakness and headaches.  ?Hematological:  Does not bruise/bleed easily.  ?All other systems reviewed and are negative. ? ?   ?Objective:  ? Physical Exam ?Vitals and nursing note reviewed.  ?Constitutional:   ?   Appearance: Normal appearance. He is well-developed.  ?HENT:  ?   Head: Normocephalic.  ?   Nose: Nose normal.  ?   Mouth/Throat:  ?   Mouth: Mucous membranes are moist.  ?   Pharynx: Oropharynx is  clear.  ?Eyes:  ?   Pupils: Pupils are equal, round, and reactive to light.  ?Neck:  ?   Thyroid: No thyroid mass or thyromegaly.  ?   Vascular: No carotid bruit or JVD.  ?   Trachea: Phonation normal.  ?Cardiovascular:  ?   Rate and Rhythm: Normal rate and regular rhythm.  ?Pulmonary:  ?   Effort: Pulmonary effort is normal. No respiratory distress.  ?   Breath sounds: Normal breath sounds.  ?Abdominal:  ?   General: Bowel sounds are normal.  ?   Palpations: Abdomen is soft.  ?   Tenderness: There is no abdominal tenderness.  ?Musculoskeletal:     ?   General: Normal range of motion.  ?   Cervical back: Normal range of motion and neck supple.  ?Lymphadenopathy:  ?   Cervical: No cervical adenopathy.  ?Skin: ?   General: Skin is warm and dry.  ?Neurological:  ?   Mental Status: He is alert and oriented to person, place, and time.  ?Psychiatric:     ?   Behavior: Behavior normal.     ?   Thought Content: Thought content normal.     ?   Judgment: Judgment normal.  ? ? ?BP (!) 144/76   Pulse 81   Temp 98.6 ?F (37 ?C) (Temporal)   Resp 20   Ht 5' 9" (1.753 m)   Wt 221 lb (100.2 kg)   SpO2 99%   BMI 32.64 kg/m?  ? ? ? ?   ?Assessment & Plan:  ?Harold Bullock comes in today with chief complaint of Medical Management of Chronic Issues ? ? ?Diagnosis and orders addressed: ? ?1. Type 2 diabetes mellitus with hyperglycemia, without long-term current use of insulin (Harold Bullock) ?Continue to watch diet ?Has not taking ozempic in over 3 weeks ?- Bayer DCA Hb A1c Waived ?- Lipid panel ?- Semaglutide,0.25 or 0.5MG/DOS, (OZEMPIC, 0.25 OR 0.5 MG/DOSE,) 2 MG/1.5ML SOPN; Inject 0.5 mg into the skin once a week.  Dispense: 1.5 mL; Refill: 5 ? ?2. Primary hypertension ?Low sodium diet ?- CBC with Differential/Platelet ?- CMP14+EGFR ?- lisinopril (ZESTRIL) 20 MG tablet; Take 1 tablet (20 mg total) by mouth daily.  Dispense: 90 tablet; Refill: 1 ? ?3. Screening for prostate cancer ?Labs pending ?- PSA, total and free ? ?4. PFO (patent  foramen ovale) ?Keep follow up with cardiology ? ?5. Coronary artery disease, non-occlusive ?Again keep folllow upo with cardiology ? ?6. OSA (obstructive sleep apnea) ?Continue to wear cpap ? ?7. Hyperlipidemia with target LDL less than 70 ?Low fat diet ?- rosuvastatin (CRESTOR) 20 MG tablet; Take 1 tablet (20 mg total) by mouth daily.  Dispense: 90 tablet; Refill: 3 ? ?8. Pulmonary nodule,  right ?Will do CT scan in September ? ?9. Obesity (BMI 30.0-34.9) ?Discussed diet and exercise for person with BMI >25 ?Will recheck weight in 3-6 months ? ? ?10. Acute left-sided low back pain with left-sided sciatica ?Moist heat ?rest ?- predniSONE (DELTASONE) 20 MG tablet; Take 2 tablets (40 mg total) by mouth daily with breakfast for 5 days. 2 po daily for 5 days  Dispense: 10 tablet; Refill: 0 ? ? ?Labs pending ?Health Maintenance reviewed ?Diet and exercise encouraged ? ?Follow up plan: ?6 months ? ? ?[name] Hassell Done, FNP ? ? ?

## 2021-04-23 NOTE — Patient Instructions (Signed)
Sleep Apnea ?Sleep apnea affects breathing during sleep. It causes breathing to stop for 10 seconds or more, or to become shallow. People with sleep apnea usually snore loudly. ?It can also increase the risk of: ?Heart attack. ?Stroke. ?Being very overweight (obese). ?Diabetes. ?Heart failure. ?Irregular heartbeat. ?High blood pressure. ?The goal of treatment is to help you breathe normally again. ?What are the causes? ?The most common cause of this condition is a collapsed or blocked airway. ?There are three kinds of sleep apnea: ?Obstructive sleep apnea. This is caused by a blocked or collapsed airway. ?Central sleep apnea. This happens when the brain does not send the right signals to the muscles that control breathing. ?Mixed sleep apnea. This is a combination of obstructive and central sleep apnea. ?What increases the risk? ?Being overweight. ?Smoking. ?Having a small airway. ?Being older. ?Being male. ?Drinking alcohol. ?Taking medicines to calm yourself (sedatives or tranquilizers). ?Having family members with the condition. ?Having a tongue or tonsils that are larger than normal. ?What are the signs or symptoms? ?Trouble staying asleep. ?Loud snoring. ?Headaches in the morning. ?Waking up gasping. ?Dry mouth or sore throat in the morning. ?Being sleepy or tired during the day. ?If you are sleepy or tired during the day, you may also: ?Not be able to focus your mind (concentrate). ?Forget things. ?Get angry a lot and have mood swings. ?Feel sad (depressed). ?Have changes in your personality. ?Have less interest in sex, if you are male. ?Be unable to have an erection, if you are male. ?How is this treated? ? ?Sleeping on your side. ?Using a medicine to get rid of mucus in your nose (decongestant). ?Avoiding the use of alcohol, medicines to help you relax, or certain pain medicines (narcotics). ?Losing weight, if needed. ?Changing your diet. ?Quitting smoking. ?Using a machine to open your airway while you  sleep, such as: ?An oral appliance. This is a mouthpiece that shifts your lower jaw forward. ?A CPAP device. This device blows air through a mask when you breathe out (exhale). ?An EPAP device. This has valves that you put in each nostril. ?A BIPAP device. This device blows air through a mask when you breathe in (inhale) and breathe out. ?Having surgery if other treatments do not work. ?Follow these instructions at home: ?Lifestyle ?Make changes that your doctor recommends. ?Eat a healthy diet. ?Lose weight if needed. ?Avoid alcohol, medicines to help you relax, and some pain medicines. ?Do not smoke or use any products that contain nicotine or tobacco. If you need help quitting, ask your doctor. ?General instructions ?Take over-the-counter and prescription medicines only as told by your doctor. ?If you were given a machine to use while you sleep, use it only as told by your doctor. ?If you are having surgery, make sure to tell your doctor you have sleep apnea. You may need to bring your device with you. ?Keep all follow-up visits. ?Contact a doctor if: ?The machine that you were given to use during sleep bothers you or does not seem to be working. ?You do not get better. ?You get worse. ?Get help right away if: ?Your chest hurts. ?You have trouble breathing in enough air. ?You have an uncomfortable feeling in your back, arms, or stomach. ?You have trouble talking. ?One side of your body feels weak. ?A part of your face is hanging down. ?These symptoms may be an emergency. Get help right away. Call your local emergency services (911 in the [place] ?Do not wait to see if the symptoms   will go away. ?Do not drive yourself to the hospital. ?Summary ?This condition affects breathing during sleep. ?The most common cause is a collapsed or blocked airway. ?The goal of treatment is to help you breathe normally while you sleep. ?This information is not intended to replace advice given to you by your health care provider. Make  sure you discuss any questions you have with your health care provider. ?Document Revised: 09/02/2020 Document Reviewed: 01/03/2020 ?Elsevier Patient Education ? 2022 [employer] ? ?

## 2021-04-24 LAB — CBC WITH DIFFERENTIAL/PLATELET
Basophils Absolute: 0.1 10*3/uL (ref 0.0–0.2)
Basos: 1 %
EOS (ABSOLUTE): 0.1 10*3/uL (ref 0.0–0.4)
Eos: 2 %
Hematocrit: 43 % (ref 37.5–51.0)
Hemoglobin: 14.9 g/dL (ref 13.0–17.7)
Immature Grans (Abs): 0 10*3/uL (ref 0.0–0.1)
Immature Granulocytes: 0 %
Lymphocytes Absolute: 1.1 10*3/uL (ref 0.7–3.1)
Lymphs: 18 %
MCH: 33.6 pg — ABNORMAL HIGH (ref 26.6–33.0)
MCHC: 34.7 g/dL (ref 31.5–35.7)
MCV: 97 fL (ref 79–97)
Monocytes Absolute: 0.6 10*3/uL (ref 0.1–0.9)
Monocytes: 9 %
Neutrophils Absolute: 4.3 10*3/uL (ref 1.4–7.0)
Neutrophils: 70 %
Platelets: 180 10*3/uL (ref 150–450)
RBC: 4.44 x10E6/uL (ref 4.14–5.80)
RDW: 11.9 % (ref 11.6–15.4)
WBC: 6.1 10*3/uL (ref 3.4–10.8)

## 2021-04-24 LAB — PSA, TOTAL AND FREE
PSA, Free Pct: 62.5 %
PSA, Free: 0.25 ng/mL
Prostate Specific Ag, Serum: 0.4 ng/mL (ref 0.0–4.0)

## 2021-04-24 LAB — CMP14+EGFR
ALT: 36 IU/L (ref 0–44)
AST: 25 IU/L (ref 0–40)
Albumin/Globulin Ratio: 2.2 (ref 1.2–2.2)
Albumin: 4.8 g/dL (ref 3.8–4.9)
Alkaline Phosphatase: 83 IU/L (ref 44–121)
BUN/Creatinine Ratio: 16 (ref 9–20)
BUN: 14 mg/dL (ref 6–24)
Bilirubin Total: 0.4 mg/dL (ref 0.0–1.2)
CO2: 24 mmol/L (ref 20–29)
Calcium: 9.6 mg/dL (ref 8.7–10.2)
Chloride: 102 mmol/L (ref 96–106)
Creatinine, Ser: 0.87 mg/dL (ref 0.76–1.27)
Globulin, Total: 2.2 g/dL (ref 1.5–4.5)
Glucose: 124 mg/dL — ABNORMAL HIGH (ref 70–99)
Potassium: 4.5 mmol/L (ref 3.5–5.2)
Sodium: 138 mmol/L (ref 134–144)
Total Protein: 7 g/dL (ref 6.0–8.5)
eGFR: 101 mL/min/{1.73_m2} (ref >59)

## 2021-04-24 LAB — LIPID PANEL
Chol/HDL Ratio: 3.5 ratio (ref 0.0–5.0)
Cholesterol, Total: 160 mg/dL (ref 100–199)
HDL: 46 mg/dL (ref >39)
LDL Chol Calc (NIH): 86 mg/dL (ref 0–99)
Triglycerides: 159 mg/dL — ABNORMAL HIGH (ref 0–149)
VLDL Cholesterol Cal: 28 mg/dL (ref 5–40)

## 2021-06-07 ENCOUNTER — Telehealth: Payer: Self-pay | Admitting: Nurse Practitioner

## 2021-06-07 DIAGNOSIS — R918 Other nonspecific abnormal finding of lung field: Secondary | ICD-10-CM

## 2021-06-07 NOTE — Telephone Encounter (Signed)
Please review

## 2021-06-07 NOTE — Telephone Encounter (Signed)
Chart says to repeat in a year but since it has been 6 months we can repeat now if you want to. ?

## 2021-06-07 NOTE — Telephone Encounter (Signed)
Patient wants to go ahead and have the scan done. Orders placed ?

## 2021-06-21 ENCOUNTER — Ambulatory Visit (HOSPITAL_COMMUNITY)
Admission: RE | Admit: 2021-06-21 | Discharge: 2021-06-21 | Disposition: A | Discharge status: Home / Self Care | Payer: BC Managed Care – PPO | Source: Ambulatory Visit | Attending: Nurse Practitioner | Admitting: Nurse Practitioner

## 2021-06-21 DIAGNOSIS — R918 Other nonspecific abnormal finding of lung field: Secondary | ICD-10-CM | POA: Diagnosis not present

## 2021-06-23 ENCOUNTER — Telehealth: Payer: Self-pay | Admitting: Nurse Practitioner

## 2021-06-23 NOTE — Telephone Encounter (Signed)
Patient informed of results. Advised of provider recommendations, patient verbalized understanding.  ? ? ?

## 2021-07-06 ENCOUNTER — Encounter: Payer: Self-pay | Admitting: Nurse Practitioner

## 2021-07-06 ENCOUNTER — Ambulatory Visit: Payer: Self-pay | Admitting: Nurse Practitioner

## 2021-07-06 DIAGNOSIS — Z024 Encounter for examination for driving license: Secondary | ICD-10-CM

## 2021-07-06 LAB — URINALYSIS
Bilirubin, UA: NEGATIVE
Glucose, UA: NEGATIVE
Ketones, UA: NEGATIVE
Leukocytes,UA: NEGATIVE
Nitrite, UA: NEGATIVE
Protein,UA: NEGATIVE
Specific Gravity, UA: 1.015 (ref 1.005–1.030)
Urobilinogen, Ur: 0.2 mg/dL (ref 0.2–1.0)
pH, UA: 6.5 (ref 5.0–7.5)

## 2021-07-06 NOTE — Progress Notes (Signed)
DOT physical- see scanned in document 

## 2021-07-28 ENCOUNTER — Ambulatory Visit (INDEPENDENT_AMBULATORY_CARE_PROVIDER_SITE_OTHER): Payer: BC Managed Care – PPO | Admitting: Cardiology

## 2021-07-28 ENCOUNTER — Encounter: Payer: Self-pay | Admitting: Cardiology

## 2021-07-28 VITALS — BP 144/82 | HR 85 | Ht 69.0 in | Wt 218.0 lb

## 2021-07-28 DIAGNOSIS — Q2112 Patent foramen ovale: Secondary | ICD-10-CM | POA: Diagnosis not present

## 2021-07-28 DIAGNOSIS — E785 Hyperlipidemia, unspecified: Secondary | ICD-10-CM

## 2021-07-28 DIAGNOSIS — I10 Essential (primary) hypertension: Secondary | ICD-10-CM | POA: Diagnosis not present

## 2021-07-28 DIAGNOSIS — I2510 Atherosclerotic heart disease of native coronary artery without angina pectoris: Secondary | ICD-10-CM

## 2021-07-28 DIAGNOSIS — E669 Obesity, unspecified: Secondary | ICD-10-CM

## 2021-07-28 DIAGNOSIS — G4733 Obstructive sleep apnea (adult) (pediatric): Secondary | ICD-10-CM

## 2021-07-28 DIAGNOSIS — R739 Hyperglycemia, unspecified: Secondary | ICD-10-CM

## 2021-07-28 MED ORDER — ASPIRIN 81 MG PO TBEC: 81.0000 mg | DELAYED_RELEASE_TABLET | Freq: Every day | ORAL | 3 refills | Status: DC

## 2021-07-28 NOTE — Patient Instructions (Addendum)
Medication Instructions:   Aspirin 81 mg  daily   *If you need a refill on your cardiac medications before your next appointment, please call your pharmacy*   Lab Work: fasting  Lipid Cmp [id number] If you have labs (blood work) drawn today and your tests are completely normal, you will receive your results only by: MyChart Message (if you have MyChart) OR A paper copy in the mail If you have any lab test that is abnormal or we need to change your treatment, we will call you to review the results.   Testing/Procedures:  Will be schedule at [address]  Your physician has requested that you have an echocardiogram with bubble study . Echocardiography is a painless test that uses sound waves to create images of your heart. It provides your doctor with information about the size and shape of your heart and how well your heart's chambers and valves are working. This procedure takes approximately one hour. There are no restrictions for this procedure.    Follow-Up: At [facility] you and your health needs are our priority.  As part of our continuing mission to provide you with exceptional heart care, we have created designated Provider Care Teams.  These Care Teams include your primary Cardiologist (physician) and Advanced Practice Providers (APPs -  Physician Assistants and Nurse Practitioners) who all work together to provide you with the care you need, when you need it.     Your next appointment:   6 month(s)  The format for your next appointment:   In Person  Provider:   Bryan Lemma, MD    Other Instructions

## 2021-07-28 NOTE — Progress Notes (Signed)
Primary Care Provider: Bennie Pierini, FNP Cardiologist: Bryan Lemma, MD Electrophysiologist: None  Clinic Note: Chief Complaint  Patient presents with   Follow-up    6 months-doing well.  No major complaints.   Coronary Artery Disease    Mild, nonocclusive by Coronary CTA.  Excellent lipid results on statin   PFO    Seen on Coronary CTA, here to discuss results.  No headaches or migraines no TIA or Amaurosis FUGAX.   ===================================  ASSESSMENT/PLAN   Problem List Items Addressed This Visit       Cardiology Problems   Hyperlipidemia with target LDL less than 70 (Chronic)    Would like to target LDL less than 70.  Current LDL is now down to 86.  Follow-up labs-lipids, chemistry and A1c ordered for this upcoming month. May need to consider increasing rosuvastatin to 40 mg or potentially adding Zetia      Relevant Medications   aspirin EC 81 MG tablet   Other Relevant Orders   ECHOCARDIOGRAM LIMITED BUBBLE STUDY   Lipid panel   Comprehensive metabolic panel   Hemoglobin A1c   PFO (patent foramen ovale) - Primary    New finding seen on CTA as a possible PFO. He is not having any symptoms to suspect concern.  Need to confirm with 2D echo with bubble.  Plan: 2D echo with bubble study Until proven otherwise, would start aspirin 81 mg. Pending presence or absence of PFO in size, may want to consider referral for closure.      Relevant Medications   aspirin EC 81 MG tablet   Other Relevant Orders   ECHOCARDIOGRAM LIMITED BUBBLE STUDY   Lipid panel   Comprehensive metabolic panel   Hemoglobin A1c   Primary hypertension (Chronic)    Blood pressure is borderline high today.  Keep an eye on the pressures for now.  Continue current dose of lisinopril.  Low threshold to consider adding low-dose diuretic such as HCTZ/chlorthalidone.      Relevant Medications   aspirin EC 81 MG tablet   Other Relevant Orders   ECHOCARDIOGRAM LIMITED BUBBLE  STUDY   Lipid panel   Comprehensive metabolic panel   Hemoglobin A1c   Coronary artery disease, non-occlusive (Chronic)    Nonocclusive CAD Coronary CTA.  No active anginal symptoms at this point.  Reiterated the importance of smoking cessation.  He had quit before, his back off and on smoking depending on his job. Continue ACE inhibitor, blood pressure stable may consider diuretic or beta-blocker if pressures increase. Initially basic Coronary Calcium Score, would not indicate aspirin, however the presence of PFO, we are adding 81 mg aspirin.      Relevant Medications   aspirin EC 81 MG tablet   Other Relevant Orders   ECHOCARDIOGRAM LIMITED BUBBLE STUDY   Lipid panel   Comprehensive metabolic panel   Hemoglobin A1c     Other   Elevated blood sugar    A1c improved to 5.7 on Ozempic.  Unfortunately, no obvious weight loss as yet.       OSA (obstructive sleep apnea) (Chronic)   Relevant Orders   ECHOCARDIOGRAM LIMITED BUBBLE STUDY   Lipid panel   Comprehensive metabolic panel   Hemoglobin A1c   Obesity (BMI 30.0-34.9) (Chronic)    The patient understands the need to lose weight with diet and exercise. We have discussed specific strategies for this. Currently on Ozempic       ===================================  HPI:    Harold Bullock is an  obese 56 y.o. male with a PMH notable for Nonocclusive CAD by Coronary CTA (Coronary Calcium Score 243-minimal CAD on cath), HTN, HLD, hyperglycemia, OSA and possible PFO noted on Coronary CTA who presents today for 29-month follow-up at the request of [name], [name], *.  [name] was last seen on January 13, 2021 via telehealth follow-up to discuss results was Coronary CTA.  Noted that his chest discomfort episodes were improved.  Just rare occasional symptoms and not associate with exertion.  Not able to get good exercise but trying likes to go walking his dog when he can.  He does walk his dog at least a mile or so and  enjoys doing about 1 mile up and [place]. Recommended starting rosuvastatin 20 mg daily -> Labs including CMP, lipids and A1c ordered => as of March 2023 TC was down to 160, LDL down to 86, HDL stable at 46 and TG down to 159.  A1c was 5.7..  (Prestatin lipids as of August 2022-TC 207, TG 242, HDL 46 and LDL 119)  Recent Hospitalizations: None  Reviewed  CV studies:    The following studies were reviewed today: (if available, images/films reviewed: From Epic Chart or Care Everywhere) None:  Interval History:   [name] returns here today overall doing pretty well.  He still has rare off-and-on chest discomfort, usually if he is eating something wrong which gives him GERD. He says is not really all that active during the week because he is working as a Marine scientist, but tries to get out and walk some when he can.  Unfortunately with the new management, he is not able to take the breaks that he used to be on TIG.  He used to try to get out at the rest stops or certain interesting sites to go out take a walk for little bit.  He cannot do that now.  He still likes to walk his dog on the weekends, and actually feels better with less chest pain or dyspnea when he walks.  He still does the staircase [place] the [place] the [place] (climbing stairs to [place] - 70 floors of stairs) on the weekends as well, just not every weekend.  He denies any chest pain associated with this.. With the bad air quality days -- had to stop a lot more than usually climbing the stairs, but otherwise done well.  CV Review of Symptoms (Summary) Cardiovascular ROS: no chest pain or dyspnea on exertion positive for - -only noting exertional dyspnea when the air quality was bad.  Otherwise doing well. negative for - edema, irregular heartbeat, orthopnea, palpitations, paroxysmal nocturnal dyspnea, rapid heart rate, shortness of breath, or history of migraine  headaches. yncope or near syncope, TIA or amaurosis fugax symptoms, claudication  REVIEWED OF SYSTEMS   Review of Systems  Constitutional:  Negative for malaise/fatigue and weight loss.  HENT:  Negative for congestion and nosebleeds.   Respiratory:  Negative for cough.        Only had a bad time with breathing during that bad air quality days.  Cardiovascular:        Per HPI  Gastrointestinal:  Negative for blood in stool and melena.  Genitourinary:  Negative for hematuria.  Musculoskeletal:  Positive for joint pain (Mild aches and pains from arthritis.). Negative for myalgias.  Neurological:  Negative for dizziness, weakness and headaches.  Psychiatric/Behavioral: Negative.    All other  systems reviewed and are negative.   I have reviewed and (if needed) personally updated the patient's problem list, medications, allergies, past medical and surgical history, social and family history.   PAST MEDICAL HISTORY   Past Medical History:  Diagnosis Date   Coronary artery disease, non-occlusive 12/11/2020   Coronary calcium score 243.  Minimal-mild nonobstructive CAD (CAD RADS 2.  Mid LAD bridging segment noted.  Minimal (<24%) proximal RCA and left main.  Minimal proximal LAD and small D1.  Mild(25-49%) proximal LCx.  Several OM branches without significant disease.   Essential hypertension    Hyperlipidemia due to dietary fat intake 09/08/2020   TC 207, TG 242, HDL 46, LDL 119.   Metabolic syndrome    Obesity, hypertension, hypertriglyceridemia.   Obese    OSA (obstructive sleep apnea) 05/21/2015    PAST SURGICAL HISTORY   Past Surgical History:  Procedure Laterality Date   BACK SURGERY  2005     Immunization History  Administered Date(s) Administered   Tdap 01/05/2015    MEDICATIONS/ALLERGIES   Current Meds  Medication Sig   aspirin EC 81 MG tablet Take 1 tablet (81 mg total) by mouth daily. Swallow whole.   Blood Glucose Monitoring Suppl (ONETOUCH VERIO REFLECT)  w/Device KIT Test BS up to 4 times daily Dx R73.9   glucose blood (ONETOUCH VERIO) test strip Use up to four times a day as directed Dx R73.9   Lancets (ONETOUCH DELICA PLUS LANCET33G) MISC Test BS up to 4 times daily Dx R73.9   lisinopril (ZESTRIL) 20 MG tablet Take 1 tablet (20 mg total) by mouth daily.   Semaglutide,0.25 or 0.5MG /DOS, (OZEMPIC, 0.25 OR 0.5 MG/DOSE,) 2 MG/1.5ML SOPN Inject 0.5 mg into the skin once a week.    No Known Allergies  SOCIAL HISTORY/FAMILY HISTORY   Reviewed in Epic:  Pertinent findings:  Social History   Tobacco Use   Smoking status: Former    Packs/day: 1.00    Years: 20.00    Total pack years: 20.00    Types: Cigarettes    Quit date: 05/27/2016    Years since quitting: 5.1   Smokeless tobacco: Never  Vaping Use   Vaping Use: Never used  Substance Use Topics   Alcohol use: Yes    Alcohol/week: 0.0 standard drinks of alcohol    Comment: 6 beers/daily   Drug use: No   Social History   Social History Narrative   Not on file    OBJCTIVE -PE, EKG, labs   Wt Readings from Last 3 Encounters:  07/28/21 218 lb (98.9 kg)  04/23/21 221 lb (100.2 kg)  01/21/21 219 lb (99.3 kg)    Physical Exam: BP (!) 144/82   Pulse 85   Ht 5\' 9"  (1.753 m)   Wt 218 lb (98.9 kg)   SpO2 95%   BMI 32.19 kg/m  Physical Exam Vitals reviewed.  Constitutional:      General: He is not in acute distress.    Appearance: Normal appearance. He is obese. He is not toxic-appearing.     Comments: Well-nourished, well-groomed.  Healthy-appearing.  HENT:     Head: Normocephalic and atraumatic.  Neck:     Vascular: No carotid bruit or JVD.  Cardiovascular:     Rate and Rhythm: Normal rate and regular rhythm. No extrasystoles are present.    Chest Wall: PMI is not displaced.     Pulses: Normal pulses.     Heart sounds: S1 normal and S2 normal. Heart sounds are  distant. No murmur heard.    No friction rub. No gallop.  Pulmonary:     Effort: Pulmonary effort is  normal. No respiratory distress.     Breath sounds: Normal breath sounds. No wheezing, rhonchi or rales.  Chest:     Chest wall: No tenderness.  Musculoskeletal:        General: No swelling. Normal range of motion.     Cervical back: Normal range of motion and neck supple.  Skin:    General: Skin is warm and dry.     Comments: No rashes or lesions  Neurological:     General: No focal deficit present.     Mental Status: He is alert and oriented to person, place, and time.     Gait: Gait normal.  Psychiatric:        Mood and Affect: Mood normal.        Behavior: Behavior normal.        Thought Content: Thought content normal.        Judgment: Judgment normal.     Adult ECG Report NA  Recent Labs: Reviewed. Lab Results  Component Value Date   CHOL 160 04/23/2021   HDL 46 04/23/2021   LDLCALC 86 04/23/2021   TRIG 159 (H) 04/23/2021   CHOLHDL 3.5 04/23/2021   Lab Results  Component Value Date   CREATININE 0.87 04/23/2021   BUN 14 04/23/2021   NA 138 04/23/2021   K 4.5 04/23/2021   CL 102 04/23/2021   CO2 24 04/23/2021      Latest Ref Rng & Units 04/23/2021   12:13 PM 01/21/2021    8:49 AM 09/08/2020   11:26 AM  CBC  WBC 3.4 - 10.8 x10E3/uL 6.1  4.8  5.4   Hemoglobin 13.0 - 17.7 g/dL 16.1  09.6  04.5   Hematocrit 37.5 - 51.0 % 43.0  44.9  42.8   Platelets 150 - 450 x10E3/uL 180  193  202     Lab Results  Component Value Date   HGBA1C 5.7 (H) 04/23/2021   No results found for: "TSH"  ================================================== I spent a total of 19 minutes with the patient spent in direct patient consultation.  Additional time spent with chart review  / charting (studies, outside notes, etc): 14 min Total Time: 33 min  Current medicines are reviewed at length with the patient today.  (+/- concerns) none  Notice: This dictation was prepared with Dragon dictation along with smart phrase technology. Any transcriptional errors that result from this process  are unintentional and may not be corrected upon review.  Studies Ordered:   Orders Placed This Encounter  Procedures   Lipid panel   Comprehensive metabolic panel   Hemoglobin A1c   ECHOCARDIOGRAM LIMITED BUBBLE STUDY   Meds ordered this encounter  Medications   aspirin EC 81 MG tablet    Sig: Take 1 tablet (81 mg total) by mouth daily. Swallow whole.    Dispense:  90 tablet    Refill:  3    Patient Instructions / Medication Changes & Studies & Tests Ordered   Patient Instructions  Medication Instructions:   Aspirin 81 mg  daily   *If you need a refill on your cardiac medications before your next appointment, please call your pharmacy*   Lab Work: fasting  Lipid Cmp [id number] If you have labs (blood work) drawn today and your tests are completely normal, you will receive your results only by: MyChart Message (if you have MyChart) OR  A paper copy in the mail If you have any lab test that is abnormal or we need to change your treatment, we will call you to review the results.   Testing/Procedures:  Will be schedule at [address]  Your physician has requested that you have an echocardiogram with bubble study . Echocardiography is a painless test that uses sound waves to create images of your heart. It provides your doctor with information about the size and shape of your heart and how well your heart's chambers and valves are working. This procedure takes approximately one hour. There are no restrictions for this procedure.    Follow-Up: At [facility], you and your health needs are our priority.  As part of our continuing mission to provide you with exceptional heart care, we have created designated Provider Care Teams.  These Care Teams include your primary Cardiologist (physician) and Advanced Practice Providers (APPs -  Physician Assistants and Nurse Practitioners) who all work together to provide you with the care you need, when you need  it.     Your next appointment:   6 month(s)  The format for your next appointment:   In Person  Provider:   Bryan Lemma, MD    Other Instructions      Bryan Lemma, M.D., M.S. Interventional Cardiologist   Pager # [phone] Phone # [phone] [address] [place], [place] [address]   Thank you for choosing Heart[facility] at [facility]!!

## 2021-08-02 ENCOUNTER — Encounter: Payer: Self-pay | Admitting: Cardiology

## 2021-08-02 NOTE — Assessment & Plan Note (Addendum)
The patient understands the need to lose weight with diet and exercise. We have discussed specific strategies for this. Currently on Ozempic

## 2021-08-02 NOTE — Assessment & Plan Note (Signed)
Blood pressure is borderline high today.  Keep an eye on the pressures for now.  Continue current dose of lisinopril.  Low threshold to consider adding low-dose diuretic such as HCTZ/chlorthalidone.

## 2021-08-26 ENCOUNTER — Other Ambulatory Visit (HOSPITAL_COMMUNITY): Payer: BC Managed Care – PPO

## 2021-08-26 ENCOUNTER — Ambulatory Visit (HOSPITAL_COMMUNITY): Payer: BC Managed Care – PPO | Attending: Internal Medicine

## 2021-08-26 DIAGNOSIS — Q2112 Patent foramen ovale: Secondary | ICD-10-CM

## 2021-08-26 DIAGNOSIS — I2510 Atherosclerotic heart disease of native coronary artery without angina pectoris: Secondary | ICD-10-CM | POA: Diagnosis not present

## 2021-08-26 DIAGNOSIS — I10 Essential (primary) hypertension: Secondary | ICD-10-CM | POA: Diagnosis present

## 2021-08-26 DIAGNOSIS — G4733 Obstructive sleep apnea (adult) (pediatric): Secondary | ICD-10-CM | POA: Diagnosis present

## 2021-08-26 DIAGNOSIS — E785 Hyperlipidemia, unspecified: Secondary | ICD-10-CM | POA: Diagnosis not present

## 2021-08-26 LAB — ECHOCARDIOGRAM LIMITED BUBBLE STUDY
Area-P 1/2: 3.23 cm2
S' Lateral: 2.7 cm

## 2021-10-25 ENCOUNTER — Encounter: Payer: Self-pay | Admitting: Nurse Practitioner

## 2021-10-25 ENCOUNTER — Ambulatory Visit (INDEPENDENT_AMBULATORY_CARE_PROVIDER_SITE_OTHER): Payer: BC Managed Care – PPO | Admitting: Nurse Practitioner

## 2021-10-25 VITALS — BP 141/74 | HR 89 | Temp 98.3°F | Resp 20 | Ht 69.0 in | Wt 216.0 lb

## 2021-10-25 DIAGNOSIS — Q2112 Patent foramen ovale: Secondary | ICD-10-CM

## 2021-10-25 DIAGNOSIS — G4733 Obstructive sleep apnea (adult) (pediatric): Secondary | ICD-10-CM

## 2021-10-25 DIAGNOSIS — I10 Essential (primary) hypertension: Secondary | ICD-10-CM

## 2021-10-25 DIAGNOSIS — E785 Hyperlipidemia, unspecified: Secondary | ICD-10-CM

## 2021-10-25 DIAGNOSIS — R911 Solitary pulmonary nodule: Secondary | ICD-10-CM

## 2021-10-25 DIAGNOSIS — I2510 Atherosclerotic heart disease of native coronary artery without angina pectoris: Secondary | ICD-10-CM | POA: Diagnosis not present

## 2021-10-25 DIAGNOSIS — E1165 Type 2 diabetes mellitus with hyperglycemia: Secondary | ICD-10-CM

## 2021-10-25 DIAGNOSIS — E669 Obesity, unspecified: Secondary | ICD-10-CM

## 2021-10-25 LAB — BAYER DCA HB A1C WAIVED: HB A1C (BAYER DCA - WAIVED): 5.8 % — ABNORMAL HIGH (ref 4.8–5.6)

## 2021-10-25 MED ORDER — LISINOPRIL 20 MG PO TABS: 20.0000 mg | ORAL_TABLET | Freq: Every day | ORAL | 1 refills | Status: DC

## 2021-10-25 MED ORDER — OZEMPIC (0.25 OR 0.5 MG/DOSE) 2 MG/1.5ML ~~LOC~~ SOPN: 0.5000 mg | PEN_INJECTOR | SUBCUTANEOUS | 5 refills | Status: DC

## 2021-10-25 MED ORDER — ROSUVASTATIN CALCIUM 20 MG PO TABS: 20.0000 mg | ORAL_TABLET | Freq: Every day | ORAL | 1 refills | Status: DC

## 2021-10-25 NOTE — Progress Notes (Signed)
Subjective:    Patient ID: Harold Bullock, male    DOB: Nov 07, 1965, 56 y.o.   MRN: [id number]   Chief Complaint: medical management of chronic issues     HPI:  Harold Bullock is a 56 y.o. who identifies as a male who was assigned male at birth.   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 Primary hypertension No c/o chest pain, sob or headache. Does not check blood pressure at home. BP Readings from Last 3 Encounters:  07/28/21 (!) 144/82  04/23/21 (!) 144/76  01/21/21 127/76     2. Hyperlipidemia with target LDL less than 70 Does not really watch diet and does no routine dedicated exercise. Lab Results  Component Value Date   CHOL 160 04/23/2021   HDL 46 04/23/2021   LDLCALC 86 04/23/2021   TRIG 159 (H) 04/23/2021   CHOLHDL 3.5 04/23/2021     3. Coronary artery disease, non-occlusive 4. PFO (patent foramen ovale) See cardiology yearly. Last appointment was 07/28/21. Review of office note revealed n change in plan of care. An echocardiogram was ordered which showed the PFO but was otherwise good. It was recommended he take a daily aspirin.  5. Pulmonary nodule, right We have been monitoring this with chest CT. Last CT was done on 06/21/21. Was uncahanged form previous.  6. OSA (obstructive sleep apnea) Wears CPAP to sleep  7. Obesity (BMI 30.0-34.9) No recent weight changes. Wt Readings from Last 3 Encounters:  10/25/21 216 lb (98 kg)  07/28/21 218 lb (98.9 kg)  04/23/21 221 lb (100.2 kg)   BMI Readings from Last 3 Encounters:  10/25/21 31.90 kg/m  07/28/21 32.19 kg/m  04/23/21 32.64 kg/m     New complaints: None  today  No Known Allergies Outpatient Encounter Medications as of 10/25/2021  Medication Sig   aspirin EC 81 MG tablet Take 1 tablet (81 mg total) by mouth daily. Swallow whole.   Blood Glucose Monitoring Suppl (ONETOUCH VERIO  REFLECT) w/Device KIT Test BS up to 4 times daily Dx R73.9   glucose blood (ONETOUCH VERIO) test strip Use up to four times a day as directed Dx R73.9   Lancets (ONETOUCH DELICA PLUS JIRCVE93Y) MISC Test BS up to 4 times daily Dx R73.9   lisinopril (ZESTRIL) 20 MG tablet Take 1 tablet (20 mg total) by mouth daily.   rosuvastatin (CRESTOR) 20 MG tablet Take 1 tablet (20 mg total) by mouth daily.   Semaglutide,0.25 or 0.5MG/DOS, (OZEMPIC, 0.25 OR 0.5 MG/DOSE,) 2 MG/1.5ML SOPN Inject 0.5 mg into the skin once a week.   No facility-administered encounter medications on file as of 10/25/2021.    Past Surgical History:  Procedure Laterality Date   BACK SURGERY  2005     Family History  Problem Relation Age of Onset   Alzheimer's disease Mother    Cancer Maternal Grandfather    Cancer Maternal Uncle    Cancer Paternal Grandmother       Controlled substance contract: n/a     Review of Systems  Constitutional:  Negative for diaphoresis.  Eyes:  Negative for pain.  Respiratory:  Negative for shortness of breath.   Cardiovascular:  Negative for chest pain, palpitations and leg swelling.  Gastrointestinal:  Negative for abdominal pain.  Endocrine: Negative for polydipsia.  Skin:  Negative for rash.  Neurological:  Negative for dizziness, weakness and headaches.  Hematological:  Does not bruise/bleed easily.  All other systems reviewed and are negative.      Objective:   Physical Exam Vitals and nursing note reviewed.  Constitutional:      Appearance: Normal appearance. He is well-developed.  HENT:     Head: Normocephalic.     Nose: Nose normal.     Mouth/Throat:     Mouth: Mucous membranes are moist.     Pharynx: Oropharynx is clear.  Eyes:     Pupils: Pupils are equal, round, and reactive to light.  Neck:     Thyroid: No thyroid mass or thyromegaly.     Vascular: No carotid bruit or JVD.     Trachea: Phonation normal.  Cardiovascular:     Rate and Rhythm: Normal rate  and regular rhythm.  Pulmonary:     Effort: Pulmonary effort is normal. No respiratory distress.     Breath sounds: Normal breath sounds.  Abdominal:     General: Bowel sounds are normal.     Palpations: Abdomen is soft.     Tenderness: There is no abdominal tenderness.  Musculoskeletal:        General: Normal range of motion.     Cervical back: Normal range of motion and neck supple.  Lymphadenopathy:     Cervical: No cervical adenopathy.  Skin:    General: Skin is warm and dry.  Neurological:     Mental Status: He is alert and oriented to person, place, and time.  Psychiatric:        Behavior: Behavior normal.        Thought Content: Thought content normal.        Judgment: Judgment normal.    BP (!) 141/74   Pulse 89   Temp 98.3 F (36.8 C) (Temporal)   Resp 20   Ht '5\' 9"'  (1.753 m)   Wt 216 lb (98 kg)   SpO2 96%   BMI 31.90 kg/m   HGBA1c 5.8%      Assessment & Plan:   Harold Bullock comes in today with chief complaint of Medical Management of Chronic Issues   Diagnosis and orders addressed:  1. Primary hypertension Low sodium diet - CBC with Differential/Platelet - CMP14+EGFR - lisinopril (ZESTRIL) 20 MG tablet; Take 1 tablet (20 mg total) by mouth daily.  Dispense: 90 tablet; Refill: 1  2. Hyperlipidemia with target LDL less than 70 Low fat diet - Lipid panel - rosuvastatin (CRESTOR) 20 MG tablet; Take 1 tablet (20 mg total) by mouth daily.  Dispense: 90 tablet; Refill: 1  3. Coronary artery disease, non-occlusive Keep yearly follow up with cardiology  4. PFO (patent foramen ovale)   5. Pulmonary nodule, right Will follow up in 3-4 months  6. OSA (obstructive sleep apnea) Continue to wear CPAP  7. Obesity (BMI 30.0-34.9) Discussed diet and exercise for person with BMI >25 Will recheck weight in 3-6 months   8. Type 2 diabetes mellitus with hyperglycemia, without long-term current use of insulin (HCC) Continue diet control - Bayer DCA Hb  A1c Waived - Microalbumin / creatinine urine ratio - Semaglutide,0.25 or 0.5MG/DOS, (OZEMPIC, 0.25 OR 0.5 MG/DOSE,) 2 MG/1.5ML SOPN; Inject 0.5 mg into the skin once a week.  Dispense: 1.5 mL; Refill: 5   Labs pending Health Maintenance reviewed Diet and exercise encouraged  Follow up plan: 6 months   [place], FNP

## 2021-10-26 LAB — CMP14+EGFR
ALT: 28 IU/L (ref 0–44)
AST: 22 IU/L (ref 0–40)
Albumin/Globulin Ratio: 1.9 (ref 1.2–2.2)
Albumin: 5 g/dL — ABNORMAL HIGH (ref 3.8–4.9)
Alkaline Phosphatase: 103 IU/L (ref 44–121)
BUN/Creatinine Ratio: 17 (ref 9–20)
BUN: 16 mg/dL (ref 6–24)
Bilirubin Total: 0.3 mg/dL (ref 0.0–1.2)
CO2: 23 mmol/L (ref 20–29)
Calcium: 10.1 mg/dL (ref 8.7–10.2)
Chloride: 99 mmol/L (ref 96–106)
Creatinine, Ser: 0.95 mg/dL (ref 0.76–1.27)
Globulin, Total: 2.6 g/dL (ref 1.5–4.5)
Glucose: 139 mg/dL — ABNORMAL HIGH (ref 70–99)
Potassium: 4.2 mmol/L (ref 3.5–5.2)
Sodium: 138 mmol/L (ref 134–144)
Total Protein: 7.6 g/dL (ref 6.0–8.5)
eGFR: 93 mL/min/{1.73_m2} (ref >59)

## 2021-10-26 LAB — LIPID PANEL
Chol/HDL Ratio: 3.8 ratio (ref 0.0–5.0)
Cholesterol, Total: 173 mg/dL (ref 100–199)
HDL: 46 mg/dL (ref >39)
LDL Chol Calc (NIH): 72 mg/dL (ref 0–99)
Triglycerides: 348 mg/dL — ABNORMAL HIGH (ref 0–149)
VLDL Cholesterol Cal: 55 mg/dL — ABNORMAL HIGH (ref 5–40)

## 2021-10-26 LAB — CBC WITH DIFFERENTIAL/PLATELET
Basophils Absolute: 0 10*3/uL (ref 0.0–0.2)
Basos: 1 %
EOS (ABSOLUTE): 0.2 10*3/uL (ref 0.0–0.4)
Eos: 3 %
Hematocrit: 41.8 % (ref 37.5–51.0)
Hemoglobin: 14.3 g/dL (ref 13.0–17.7)
Immature Grans (Abs): 0 10*3/uL (ref 0.0–0.1)
Immature Granulocytes: 0 %
Lymphocytes Absolute: 1.1 10*3/uL (ref 0.7–3.1)
Lymphs: 17 %
MCH: 34.4 pg — ABNORMAL HIGH (ref 26.6–33.0)
MCHC: 34.2 g/dL (ref 31.5–35.7)
MCV: 101 fL — ABNORMAL HIGH (ref 79–97)
Monocytes Absolute: 0.5 10*3/uL (ref 0.1–0.9)
Monocytes: 7 %
Neutrophils Absolute: 4.8 10*3/uL (ref 1.4–7.0)
Neutrophils: 72 %
Platelets: 180 10*3/uL (ref 150–450)
RBC: 4.16 x10E6/uL (ref 4.14–5.80)
RDW: 11.7 % (ref 11.6–15.4)
WBC: 6.6 10*3/uL (ref 3.4–10.8)

## 2021-12-01 ENCOUNTER — Telehealth: Payer: Self-pay

## 2021-12-01 NOTE — Telephone Encounter (Signed)
-----   Message from Chevis Pretty, [place] sent at 10/12/2021  9:14 AM EDT ----- Regarding: FW: lung nodule Let ppatient know it is time for chest CT- would he like [place] to schedule ----- Message ----- From: Chevis Pretty, FNP Sent: 10/08/2021  12:00 AM EDT To: Chevis Pretty, FNP Subject: lung nodule                                    Needs chest CT in sept

## 2021-12-01 NOTE — Telephone Encounter (Signed)
Patient had CT repeated in May because he wanted to hae done early. Told to follow up in 1 year per report

## 2022-01-06 ENCOUNTER — Encounter: Payer: Self-pay | Admitting: Nurse Practitioner

## 2022-01-06 ENCOUNTER — Ambulatory Visit (INDEPENDENT_AMBULATORY_CARE_PROVIDER_SITE_OTHER): Payer: BC Managed Care – PPO | Admitting: Nurse Practitioner

## 2022-01-06 VITALS — BP 132/71 | HR 82 | Temp 98.1°F | Resp 20 | Ht 69.0 in | Wt 217.0 lb

## 2022-01-06 DIAGNOSIS — S8011XA Contusion of right lower leg, initial encounter: Secondary | ICD-10-CM

## 2022-01-06 DIAGNOSIS — Z09 Encounter for follow-up examination after completed treatment for conditions other than malignant neoplasm: Secondary | ICD-10-CM | POA: Diagnosis not present

## 2022-01-06 NOTE — Patient Instructions (Signed)
Contusion A contusion is a deep bruise. This is a result of an injury that causes bleeding under the skin. Symptoms of bruising include pain, swelling, and discolored skin. The skin may turn blue, purple, or yellow. Follow these instructions at home: Managing pain, stiffness, and swelling You may use RICE. This stands for: Resting. Icing. Compression, or putting pressure on the injured area. Elevating, or raising the injured area. To follow this method, do these actions: Rest the injured area. If told, put ice on the injured area. To do this: Put ice in a plastic bag. Place a towel between your skin and the bag. Leave the ice on for 20 minutes, 2-3 times per day. If your skin turns bright red, take off the ice right away to prevent skin damage. The risk of skin damage is higher if you cannot feel pain, heat, or cold. If told, apply compression on the injured area using an elastic bandage. Make sure the bandage is not too tight. If the area tingles or has a loss of feeling (numbness), remove it and put it back on as told by your doctor. If possible, elevate the injured area above the level of your heart while you are sitting or lying down.  General instructions Take over-the-counter and prescription medicines only as told by your doctor. Keep all follow-up visits. Your doctor may want to see how your contusion is healing with treatment. Contact a doctor if: Your symptoms do not get better after several days of treatment. Your symptoms get worse. You have trouble moving the injured area. Get help right away if: You have very bad pain. You have a loss of feeling (numbness) in a hand or foot. Your hand or foot turns pale or cold. This information is not intended to replace advice given to you by your health care provider. Make sure you discuss any questions you have with your health care provider. Document Revised: 07/12/2021 Document Reviewed: 07/12/2021 Elsevier Patient Education  2023  [employer]  
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
## 2022-01-19 ENCOUNTER — Telehealth: Payer: Self-pay | Admitting: Nurse Practitioner

## 2022-01-19 NOTE — Telephone Encounter (Signed)
I'm thinking they no longer give [place] the small certificate cards and the patient only needed the front sheet.  Please advise.

## 2022-01-20 NOTE — Telephone Encounter (Signed)
They no longer do cards. The paper was given to him.

## 2022-01-20 NOTE — Telephone Encounter (Signed)
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 His company will check on this for him. Will call if he needs [place]

## 2022-04-10 IMAGING — DX DG ANKLE COMPLETE 3+V*L*
3 series · 3 of 3 positions shown · non-contrast
Comparison: No recent.

CLINICAL DATA: Ankle pain.

EXAM:
LEFT ANKLE COMPLETE - 3+ VIEW

[ankle ap]
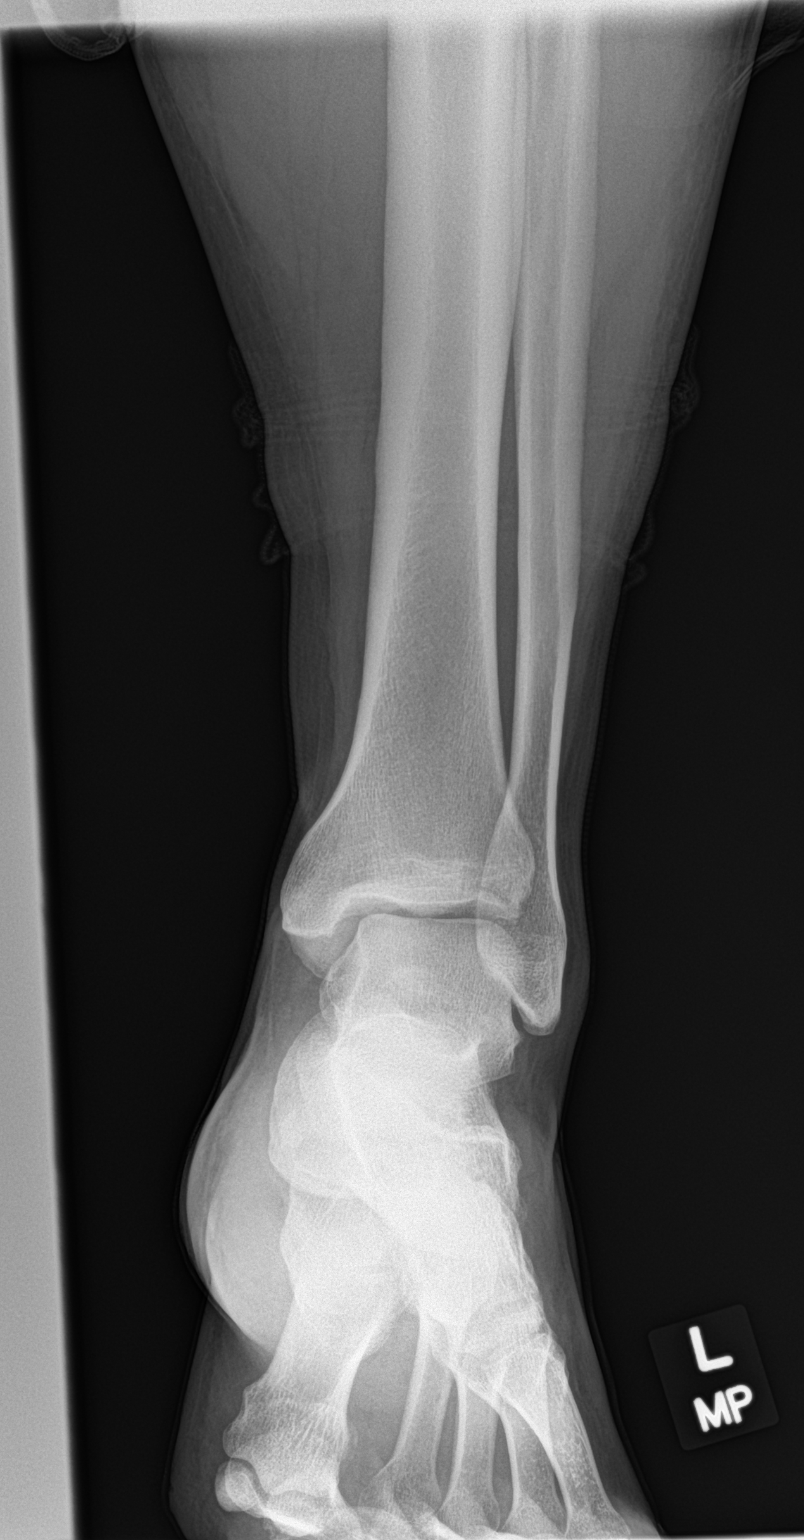

[ankle obl]
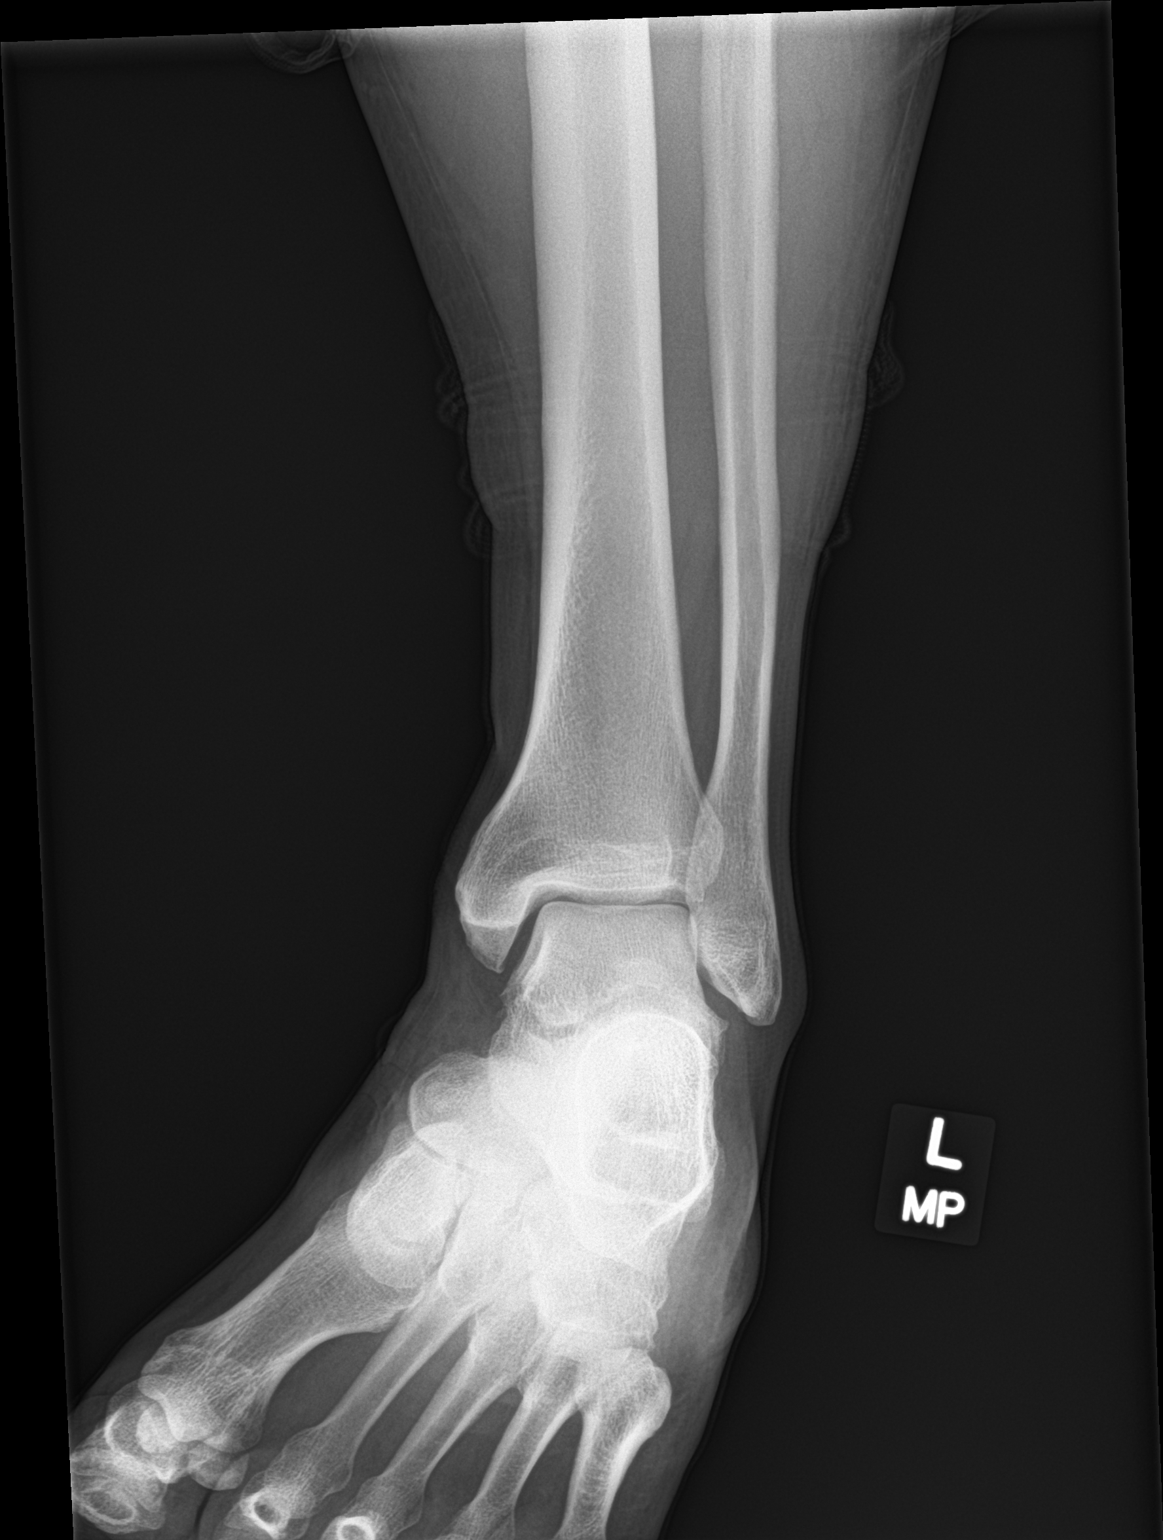

[ankle lat]
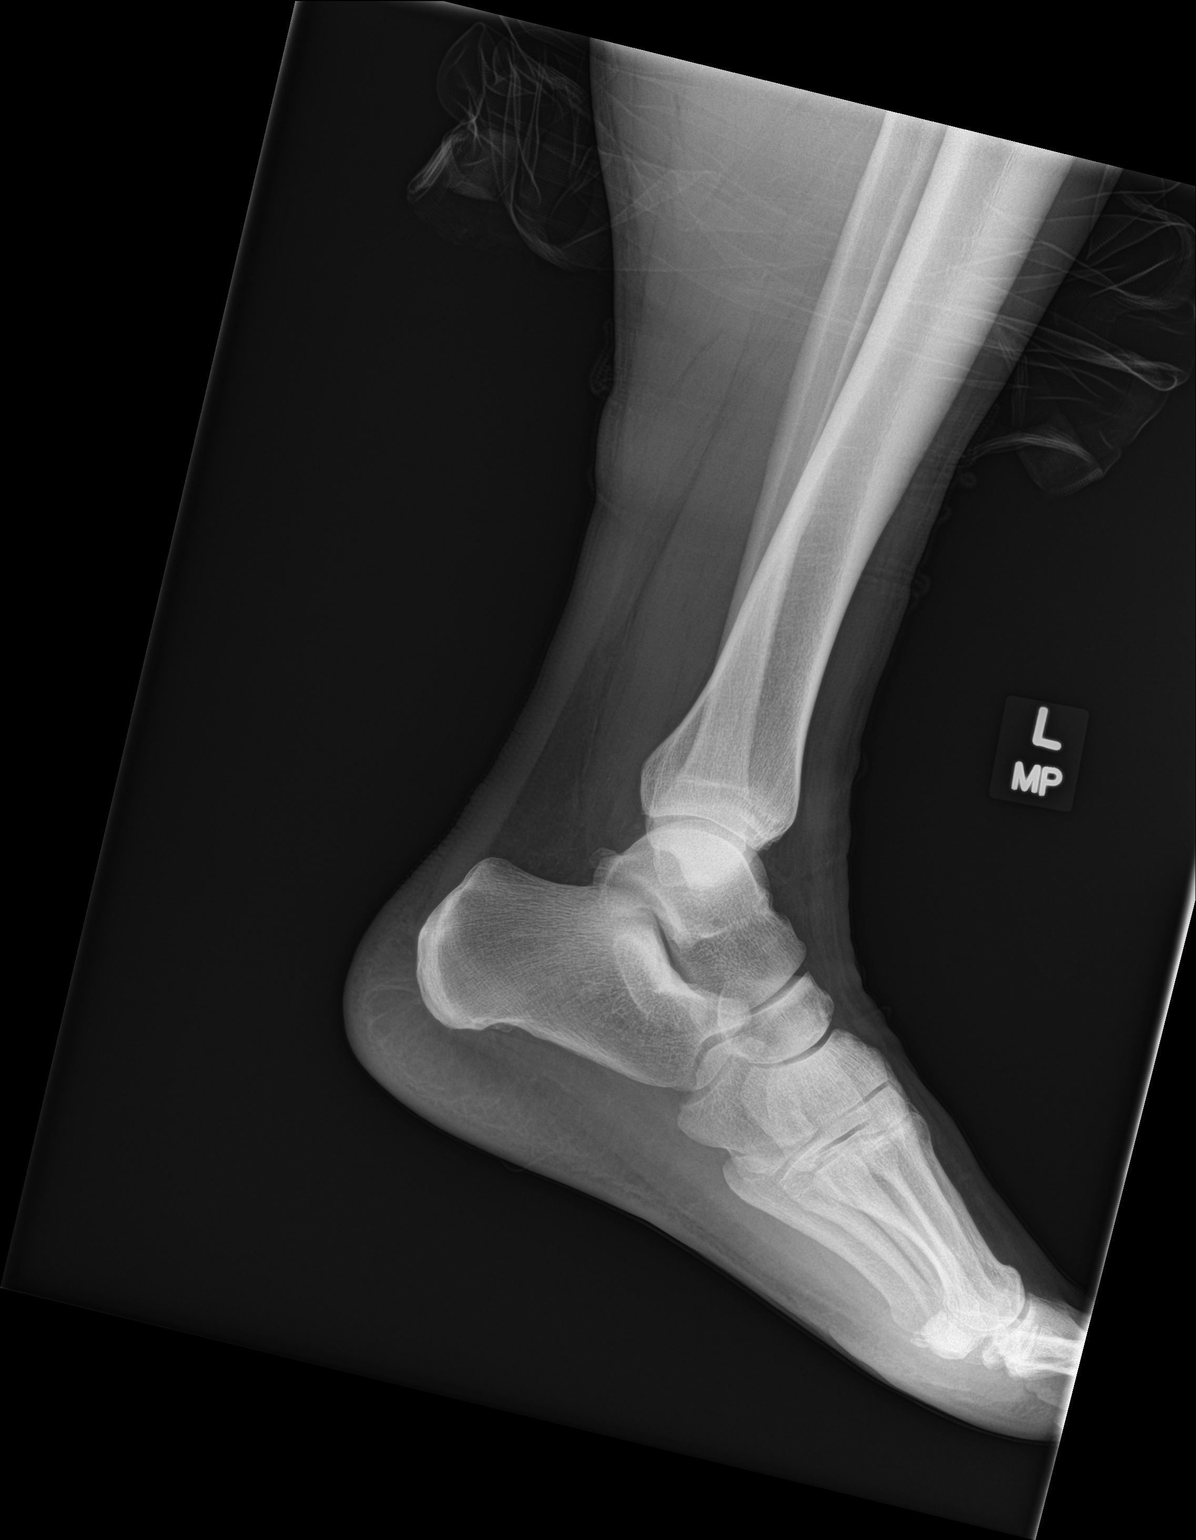

[3 of 3 positions shown; findings below may reference images not displayed]

FINDINGS: No acute bony or joint abnormality. No evidence of fracture or
dislocation.
IMPRESSION: No acute abnormality.

## 2022-04-18 ENCOUNTER — Telehealth: Payer: Self-pay | Admitting: Nurse Practitioner

## 2022-04-18 NOTE — Telephone Encounter (Signed)
Pt aware of provider feedback and voiced understanding. 

## 2022-04-18 NOTE — Telephone Encounter (Signed)
Try taking crestor every other day and see if helps

## 2022-04-18 NOTE — Telephone Encounter (Signed)
Stop it and see if get sbetter

## 2022-04-18 NOTE — Telephone Encounter (Signed)
Patient stated that he feels like his muscles are getting weak and thinks that it is due to his cholesterol medication - rosuvastatin (CRESTOR) 20 MG tablet

## 2022-04-18 NOTE — Telephone Encounter (Signed)
Pt states he has been taking it every other day for the last 3 weeks without any improvement.

## 2022-04-25 ENCOUNTER — Ambulatory Visit (INDEPENDENT_AMBULATORY_CARE_PROVIDER_SITE_OTHER): Payer: BC Managed Care – PPO | Admitting: Nurse Practitioner

## 2022-04-25 ENCOUNTER — Encounter: Payer: Self-pay | Admitting: Nurse Practitioner

## 2022-04-25 VITALS — BP 169/84 | HR 81 | Temp 97.1°F | Resp 20 | Ht 70.8 in | Wt 220.0 lb

## 2022-04-25 DIAGNOSIS — G4733 Obstructive sleep apnea (adult) (pediatric): Secondary | ICD-10-CM

## 2022-04-25 DIAGNOSIS — I10 Essential (primary) hypertension: Secondary | ICD-10-CM

## 2022-04-25 DIAGNOSIS — E785 Hyperlipidemia, unspecified: Secondary | ICD-10-CM | POA: Diagnosis not present

## 2022-04-25 DIAGNOSIS — I2510 Atherosclerotic heart disease of native coronary artery without angina pectoris: Secondary | ICD-10-CM | POA: Diagnosis not present

## 2022-04-25 DIAGNOSIS — E1165 Type 2 diabetes mellitus with hyperglycemia: Secondary | ICD-10-CM

## 2022-04-25 DIAGNOSIS — Q2112 Patent foramen ovale: Secondary | ICD-10-CM

## 2022-04-25 DIAGNOSIS — E669 Obesity, unspecified: Secondary | ICD-10-CM

## 2022-04-25 MED ORDER — LISINOPRIL 20 MG PO TABS: 20.0000 mg | ORAL_TABLET | Freq: Every day | ORAL | 1 refills | Status: DC

## 2022-04-25 NOTE — Progress Notes (Signed)
Subjective:    Patient ID: Harold Bullock, male    DOB: 1965-07-05, 57 y.o.   MRN: [id number]   Chief Complaint: Medical Management of Chronic Issues    HPI:  Harold Bullock is a 57 y.o. who identifies as a male who was assigned male at birth.   Social history: Lives with: wife   Comes in today for follow up of the following chronic medical issues:  1. Primary hypertension No c/o chest pain, sob or headache, does not check blood pressure at home. HE FORGOT TO TAKE HIS BLOOD PRESSURE MEDS THIS MORNING! BP Readings from Last 3 Encounters:  04/25/22 (!) 169/84  01/06/22 132/71  10/25/21 (!) 141/74     2. Hyperlipidemia with target LDL less than 70 Does try to watch diet and stays very active. Lab Results  Component Value Date   CHOL 173 10/25/2021   HDL 46 10/25/2021   LDLCALC 72 10/25/2021   TRIG 348 (H) 10/25/2021   CHOLHDL 3.8 10/25/2021     3. Type 2 diabetes mellitus with hyperglycemia, without long-term current use of insulin (HCC) Doe snot check blood sugars at home. Not doing shots or taking pilss right  now. Just doing diet control Lab Results  Component Value Date   HGBA1C 5.8 (H) 10/25/2021     4. Coronary artery disease, non-occlusive 5. PFO (patent foramen ovale) Last saw cardiology 07/27/21. No change to plan of care. He is suppose to follow up every year.  6. OSA (obstructive sleep apnea) Wears cpap nightly  7. Obesity (BMI 30.0-34.9) No recent weight changes Wt Readings from Last 3 Encounters:  04/25/22 220 lb (99.8 kg)  01/06/22 217 lb (98.4 kg)  10/25/21 216 lb (98 kg)   BMI Readings from Last 3 Encounters:  04/25/22 30.86 kg/m  01/06/22 32.05 kg/m  10/25/21 31.90 kg/m      New complaints: None today  No Known Allergies Outpatient Encounter Medications as of 04/25/2022  Medication Sig   aspirin EC 81 MG tablet Take 1 tablet (81 mg total) by mouth daily. Swallow whole.   Blood Glucose Monitoring Suppl (ONETOUCH VERIO  REFLECT) w/Device KIT Test BS up to 4 times daily Dx R73.9   glucose blood (ONETOUCH VERIO) test strip Use up to four times a day as directed Dx R73.9   Lancets (ONETOUCH DELICA PLUS 123XX123) MISC Test BS up to 4 times daily Dx R73.9   lisinopril (ZESTRIL) 20 MG tablet Take 1 tablet (20 mg total) by mouth daily.   Semaglutide,0.25 or 0.5MG /DOS, (OZEMPIC, 0.25 OR 0.5 MG/DOSE,) 2 MG/1.5ML SOPN Inject 0.5 mg into the skin once a week.   rosuvastatin (CRESTOR) 20 MG tablet Take 1 tablet (20 mg total) by mouth daily.   No facility-administered encounter medications on file as of 04/25/2022.    Past Surgical History:  Procedure Laterality Date   BACK SURGERY  2005     Family History  Problem Relation Age of Onset   Alzheimer's disease Mother    Cancer Maternal Grandfather    Cancer Maternal Uncle    Cancer Paternal Grandmother       Controlled substance contract: n/a     Review of Systems     Objective:   Physical Exam Vitals and nursing note reviewed.  Constitutional:      Appearance: Normal appearance. He is well-developed.  HENT:     Head: Normocephalic.     Nose: Nose normal.     Mouth/Throat:     Mouth: Mucous  membranes are moist.     Pharynx: Oropharynx is clear.  Eyes:     Pupils: Pupils are equal, round, and reactive to light.  Neck:     Thyroid: No thyroid mass or thyromegaly.     Vascular: No carotid bruit or JVD.     Trachea: Phonation normal.  Cardiovascular:     Rate and Rhythm: Normal rate and regular rhythm.  Pulmonary:     Effort: Pulmonary effort is normal. No respiratory distress.     Breath sounds: Normal breath sounds.  Abdominal:     General: Bowel sounds are normal.     Palpations: Abdomen is soft.     Tenderness: There is no abdominal tenderness.  Musculoskeletal:        General: Normal range of motion.     Cervical back: Normal range of motion and neck supple.  Lymphadenopathy:     Cervical: No cervical adenopathy.  Skin:    General:  Skin is warm and dry.  Neurological:     Mental Status: He is alert and oriented to person, place, and time.  Psychiatric:        Behavior: Behavior normal.        Thought Content: Thought content normal.        Judgment: Judgment normal.     BP (!) 169/84   Pulse 81   Temp (!) 97.1 F (36.2 C)   Resp 20   Ht 5' 10.8" (1.798 m)   Wt 220 lb (99.8 kg)   SpO2 97%   BMI 30.86 kg/m   Hba1c 6.8%      Assessment & Plan:  Harold Bullock comes in today with chief complaint of Medical Management of Chronic Issues   Diagnosis and orders addressed:  1. Primary hypertension Low sodium diet Keep diary of blood sugars at home - Lipid panel - CBC with Differential/Platelet - lisinopril (ZESTRIL) 20 MG tablet; Take 1 tablet (20 mg total) by mouth daily.  Dispense: 90 tablet; Refill: 1  2. Hyperlipidemia with target LDL less than 70 Low fat diet Refuses statin now - CMP14+EGFR  3. Type 2 diabetes mellitus with hyperglycemia, without long-term current use of insulin (HCC) Strict low carb diet - Bayer DCA Hb A1c Waived - Microalbumin / creatinine urine ratio  4. Coronary artery disease, non-occlusive Keep yearly follownup with cardiology  5. PFO (patent foramen ovale)  6. OSA (obstructive sleep apnea) Continue to wear cpap  7. Obesity (BMI 30.0-34.9) Discussed diet and exercise for person with BMI >25 Will recheck weight in 3-6 months    Labs pending Health Maintenance reviewed Diet and exercise encouraged  Follow up plan: 3 months    [name] Hassell Done, FNP

## 2022-04-25 NOTE — Patient Instructions (Signed)

## 2022-04-26 LAB — CMP14+EGFR
ALT: 24 IU/L (ref 0–44)
AST: 19 IU/L (ref 0–40)
Albumin/Globulin Ratio: 2.2 (ref 1.2–2.2)
Albumin: 5 g/dL — ABNORMAL HIGH (ref 3.8–4.9)
Alkaline Phosphatase: 108 IU/L (ref 44–121)
BUN/Creatinine Ratio: 19 (ref 9–20)
BUN: 15 mg/dL (ref 6–24)
Bilirubin Total: 0.2 mg/dL (ref 0.0–1.2)
CO2: 23 mmol/L (ref 20–29)
Calcium: 9.8 mg/dL (ref 8.7–10.2)
Chloride: 103 mmol/L (ref 96–106)
Creatinine, Ser: 0.81 mg/dL (ref 0.76–1.27)
Globulin, Total: 2.3 g/dL (ref 1.5–4.5)
Glucose: 110 mg/dL — ABNORMAL HIGH (ref 70–99)
Potassium: 4.4 mmol/L (ref 3.5–5.2)
Sodium: 140 mmol/L (ref 134–144)
Total Protein: 7.3 g/dL (ref 6.0–8.5)
eGFR: 102 mL/min/{1.73_m2} (ref >59)

## 2022-04-26 LAB — CBC WITH DIFFERENTIAL/PLATELET
Basophils Absolute: 0.1 10*3/uL (ref 0.0–0.2)
Basos: 1 %
EOS (ABSOLUTE): 0.1 10*3/uL (ref 0.0–0.4)
Eos: 2 %
Hematocrit: 42.2 % (ref 37.5–51.0)
Hemoglobin: 14.7 g/dL (ref 13.0–17.7)
Immature Grans (Abs): 0 10*3/uL (ref 0.0–0.1)
Immature Granulocytes: 0 %
Lymphocytes Absolute: 1.2 10*3/uL (ref 0.7–3.1)
Lymphs: 20 %
MCH: 33.6 pg — ABNORMAL HIGH (ref 26.6–33.0)
MCHC: 34.8 g/dL (ref 31.5–35.7)
MCV: 97 fL (ref 79–97)
Monocytes Absolute: 0.5 10*3/uL (ref 0.1–0.9)
Monocytes: 8 %
Neutrophils Absolute: 4.1 10*3/uL (ref 1.4–7.0)
Neutrophils: 69 %
Platelets: 204 10*3/uL (ref 150–450)
RBC: 4.37 x10E6/uL (ref 4.14–5.80)
RDW: 11.8 % (ref 11.6–15.4)
WBC: 5.9 10*3/uL (ref 3.4–10.8)

## 2022-04-26 LAB — LIPID PANEL
Chol/HDL Ratio: 5.1 ratio — ABNORMAL HIGH (ref 0.0–5.0)
Cholesterol, Total: 233 mg/dL — ABNORMAL HIGH (ref 100–199)
HDL: 46 mg/dL (ref >39)
LDL Chol Calc (NIH): 129 mg/dL — ABNORMAL HIGH (ref 0–99)
Triglycerides: 328 mg/dL — ABNORMAL HIGH (ref 0–149)
VLDL Cholesterol Cal: 58 mg/dL — ABNORMAL HIGH (ref 5–40)

## 2022-04-26 LAB — BAYER DCA HB A1C WAIVED: HB A1C (BAYER DCA - WAIVED): 6.8 % — ABNORMAL HIGH (ref 4.8–5.6)

## 2022-04-26 LAB — MICROALBUMIN / CREATININE URINE RATIO
Creatinine, Urine: 67.7 mg/dL
Microalb/Creat Ratio: 44 mg/g creat — ABNORMAL HIGH (ref 0–29)
Microalbumin, Urine: 29.6 ug/mL

## 2022-06-10 ENCOUNTER — Other Ambulatory Visit: Payer: Self-pay | Admitting: Nurse Practitioner

## 2022-06-10 DIAGNOSIS — E785 Hyperlipidemia, unspecified: Secondary | ICD-10-CM

## 2022-06-22 ENCOUNTER — Other Ambulatory Visit: Payer: Self-pay

## 2022-06-22 DIAGNOSIS — R918 Other nonspecific abnormal finding of lung field: Secondary | ICD-10-CM

## 2022-07-25 ENCOUNTER — Ambulatory Visit (INDEPENDENT_AMBULATORY_CARE_PROVIDER_SITE_OTHER): Payer: BC Managed Care – PPO | Admitting: Nurse Practitioner

## 2022-07-25 ENCOUNTER — Encounter: Payer: Self-pay | Admitting: Nurse Practitioner

## 2022-07-25 VITALS — BP 134/79 | HR 90 | Temp 98.3°F | Resp 20 | Ht 70.0 in | Wt 206.0 lb

## 2022-07-25 DIAGNOSIS — E66811 Obesity, class 1: Secondary | ICD-10-CM

## 2022-07-25 DIAGNOSIS — G4733 Obstructive sleep apnea (adult) (pediatric): Secondary | ICD-10-CM

## 2022-07-25 DIAGNOSIS — I10 Essential (primary) hypertension: Secondary | ICD-10-CM | POA: Diagnosis not present

## 2022-07-25 DIAGNOSIS — E119 Type 2 diabetes mellitus without complications: Secondary | ICD-10-CM

## 2022-07-25 DIAGNOSIS — E669 Obesity, unspecified: Secondary | ICD-10-CM

## 2022-07-25 DIAGNOSIS — E785 Hyperlipidemia, unspecified: Secondary | ICD-10-CM

## 2022-07-25 DIAGNOSIS — Z125 Encounter for screening for malignant neoplasm of prostate: Secondary | ICD-10-CM | POA: Diagnosis not present

## 2022-07-25 DIAGNOSIS — Q2112 Patent foramen ovale: Secondary | ICD-10-CM

## 2022-07-25 DIAGNOSIS — I2510 Atherosclerotic heart disease of native coronary artery without angina pectoris: Secondary | ICD-10-CM

## 2022-07-25 DIAGNOSIS — R931 Abnormal findings on diagnostic imaging of heart and coronary circulation: Secondary | ICD-10-CM

## 2022-07-25 DIAGNOSIS — R911 Solitary pulmonary nodule: Secondary | ICD-10-CM

## 2022-07-25 LAB — BAYER DCA HB A1C WAIVED: HB A1C (BAYER DCA - WAIVED): 5.7 % — ABNORMAL HIGH (ref 4.8–5.6)

## 2022-07-25 MED ORDER — LISINOPRIL 20 MG PO TABS: 20.0000 mg | ORAL_TABLET | Freq: Every day | ORAL | 1 refills | Status: DC

## 2022-07-25 MED ORDER — ROSUVASTATIN CALCIUM 20 MG PO TABS: 20.0000 mg | ORAL_TABLET | Freq: Every day | ORAL | 1 refills | Status: DC

## 2022-07-25 NOTE — Patient Instructions (Signed)

## 2022-07-25 NOTE — Progress Notes (Addendum)
Subjective:    Patient ID: Harold Bullock, male    DOB: [birth year], 57 y.o.   MRN: [id number]   Chief Complaint: Medical Management of Chronic Issues    HPI:  Harold Bullock is a 58 y.o. who identifies as a male who was assigned male at birth.   Social history: Lives with: wife   Comes in today for follow up of the following chronic medical issues:  1. Primary hypertension No c/o chest pain, sob or headache. Does not check blood pressure at home. BP Readings from Last 3 Encounters:  07/25/22 134/79  04/25/22 (!) 169/84  01/06/22 132/71     2. Hyperlipidemia with target LDL less than 70 Hs been watching diet and stays very active. Is on crestor daily Lab Results  Component Value Date   CHOL 233 (H) 04/25/2022   HDL 46 04/25/2022   LDLCALC 129 (H) 04/25/2022   TRIG 328 (H) 04/25/2022   CHOLHDL 5.1 (H) 04/25/2022     3. Diet-controlled diabetes mellitus (HCC) His blood sugaras have been good when he checks them. Does not check every day Lab Results  Component Value Date   HGBA1C 6.8 (H) 04/25/2022     4. Screening for prostate cancer No voiding issues Lab Results  Component Value Date   PSA1 0.4 04/23/2021   PSA1 0.5 02/13/2017   PSA1 0.4 01/05/2015      5. Agatston CAC score 200-399 6. Coronary artery disease, non-occlusive 7. PFO (patent foramen ovale) Last saw cardiology on 07/28/21. Only change made to plan of care was increase in crestor to 40mg  daily  8. Pulmonary nodule, right We are watching pulmonary nodule. Repeat was due in May but was not scheduled  9. OSA (obstructive sleep apnea) Wears cpap nightly  10. Obesity (BMI 30.0-34.9) Weight is down 14 lbs Wt Readings from Last 3 Encounters:  07/25/22 206 lb (93.4 kg)  04/25/22 220 lb (99.8 kg)  01/06/22 217 lb (98.4 kg)   BMI Readings from Last 3 Encounters:  07/25/22 29.56 kg/m  04/25/22 30.86 kg/m  01/06/22 32.05 kg/m      New complaints: None today  No Known  Allergies Outpatient Encounter Medications as of 07/25/2022  Medication Sig   aspirin EC 81 MG tablet Take 1 tablet (81 mg total) by mouth daily. Swallow whole.   lisinopril (ZESTRIL) 20 MG tablet Take 1 tablet (20 mg total) by mouth daily.   Blood Glucose Monitoring Suppl (ONETOUCH VERIO REFLECT) w/Device KIT Test BS up to 4 times daily Dx R73.9   glucose blood (ONETOUCH VERIO) test strip Use up to four times a day as directed Dx R73.9   Lancets (ONETOUCH DELICA PLUS LANCET33G) MISC Test BS up to 4 times daily Dx R73.9   rosuvastatin (CRESTOR) 20 MG tablet TAKE 1 TABLET BY MOUTH EVERY DAY (Patient not taking: Reported on 07/25/2022)   No facility-administered encounter medications on file as of 07/25/2022.    Past Surgical History:  Procedure Laterality Date   BACK SURGERY  2005     Family History  Problem Relation Age of Onset   Alzheimer's disease Mother    Cancer Maternal Grandfather    Cancer Maternal Uncle    Cancer Paternal Grandmother       Controlled substance contract: n/a     Review of Systems  Constitutional:  Negative for diaphoresis.  Eyes:  Negative for pain.  Respiratory:  Negative for shortness of breath.   Cardiovascular:  Negative for chest pain, palpitations and  leg swelling.  Gastrointestinal:  Negative for abdominal pain.  Endocrine: Negative for polydipsia.  Skin:  Negative for rash.  Neurological:  Negative for dizziness, weakness and headaches.  Hematological:  Does not bruise/bleed easily.  All other systems reviewed and are negative.      Objective:   Physical Exam Vitals and nursing note reviewed.  Constitutional:      Appearance: Normal appearance. He is well-developed.  HENT:     Head: Normocephalic.     Nose: Nose normal.     Mouth/Throat:     Mouth: Mucous membranes are moist.     Pharynx: Oropharynx is clear.  Eyes:     Pupils: Pupils are equal, round, and reactive to light.  Neck:     Thyroid: No thyroid mass or thyromegaly.      Vascular: No carotid bruit or JVD.     Trachea: Phonation normal.  Cardiovascular:     Rate and Rhythm: Normal rate and regular rhythm.  Pulmonary:     Effort: Pulmonary effort is normal. No respiratory distress.     Breath sounds: Normal breath sounds.  Abdominal:     General: Bowel sounds are normal.     Palpations: Abdomen is soft.     Tenderness: There is no abdominal tenderness.  Musculoskeletal:        General: Normal range of motion.     Cervical back: Normal range of motion and neck supple.  Lymphadenopathy:     Cervical: No cervical adenopathy.  Skin:    General: Skin is warm and dry.  Neurological:     Mental Status: He is alert and oriented to person, place, and time.  Psychiatric:        Behavior: Behavior normal.        Thought Content: Thought content normal.        Judgment: Judgment normal.     BP 134/79   Pulse 90   Temp 98.3 F (36.8 C) (Temporal)   Resp 20   Ht 5\' 10"  (1.778 m)   Wt 206 lb (93.4 kg)   SpO2 97%   BMI 29.56 kg/m   HGBA1c 5.7%      Assessment & Plan:  Harold Bullock comes in today with chief complaint of Medical Management of Chronic Issues   Diagnosis and orders addressed:  1. Primary hypertension Low sodium diet - CBC with Differential/Platelet - CMP14+EGFR - lisinopril (ZESTRIL) 20 MG tablet; Take 1 tablet (20 mg total) by mouth daily.  Dispense: 90 tablet; Refill: 1  2. Hyperlipidemia with target LDL less than 70 Low fat diet - Lipid panel - rosuvastatin (CRESTOR) 20 MG tablet; Take 1 tablet (20 mg total) by mouth daily.  Dispense: 90 tablet; Refill: 1  3. Diet-controlled diabetes mellitus (HCC) Continue to watch diet - Bayer DCA Hb A1c Waived  4. Screening for prostate cancer Labs pending - PSA, total and free  5. Agatston CAC score 200-399 6. Coronary artery disease, non-occlusive 7. PFO (patent foramen ovale) Keep follow up with cardiology  8. Pulmonary nodule, right Will find out why chest CT has  no t been schedyuled yet  9. OSA (obstructive sleep apnea) Continue  to wear cpap at night  10. Obesity (BMI 30.0-34.9) Continue to watch diet and exercise   Labs pending Health Maintenance reviewed Diet and exercise encouraged  Follow up plan: 6 months   [name] [name], FNP

## 2022-07-26 LAB — CBC WITH DIFFERENTIAL/PLATELET
Basophils Absolute: 0.1 10*3/uL (ref 0.0–0.2)
Basos: 1 %
EOS (ABSOLUTE): 0.1 10*3/uL (ref 0.0–0.4)
Eos: 2 %
Hematocrit: 42.8 % (ref 37.5–51.0)
Hemoglobin: 14.8 g/dL (ref 13.0–17.7)
Immature Grans (Abs): 0 10*3/uL (ref 0.0–0.1)
Immature Granulocytes: 0 %
Lymphocytes Absolute: 1.1 10*3/uL (ref 0.7–3.1)
Lymphs: 17 %
MCH: 33.6 pg — ABNORMAL HIGH (ref 26.6–33.0)
MCHC: 34.6 g/dL (ref 31.5–35.7)
MCV: 97 fL (ref 79–97)
Monocytes Absolute: 0.6 10*3/uL (ref 0.1–0.9)
Monocytes: 9 %
Neutrophils Absolute: 4.4 10*3/uL (ref 1.4–7.0)
Neutrophils: 71 %
Platelets: 202 10*3/uL (ref 150–450)
RBC: 4.41 x10E6/uL (ref 4.14–5.80)
RDW: 12.2 % (ref 11.6–15.4)
WBC: 6.3 10*3/uL (ref 3.4–10.8)

## 2022-07-26 LAB — CMP14+EGFR
ALT: 22 IU/L (ref 0–44)
AST: 20 IU/L (ref 0–40)
Albumin: 4.7 g/dL (ref 3.8–4.9)
Alkaline Phosphatase: 102 IU/L (ref 44–121)
BUN/Creatinine Ratio: 14 (ref 9–20)
BUN: 14 mg/dL (ref 6–24)
Bilirubin Total: 0.4 mg/dL (ref 0.0–1.2)
CO2: 21 mmol/L (ref 20–29)
Calcium: 9.9 mg/dL (ref 8.7–10.2)
Chloride: 100 mmol/L (ref 96–106)
Creatinine, Ser: 0.97 mg/dL (ref 0.76–1.27)
Globulin, Total: 2.6 g/dL (ref 1.5–4.5)
Glucose: 103 mg/dL — ABNORMAL HIGH (ref 70–99)
Potassium: 4.1 mmol/L (ref 3.5–5.2)
Sodium: 139 mmol/L (ref 134–144)
Total Protein: 7.3 g/dL (ref 6.0–8.5)
eGFR: 90 mL/min/{1.73_m2} (ref >59)

## 2022-07-26 LAB — LIPID PANEL
Chol/HDL Ratio: 4.1 ratio (ref 0.0–5.0)
Cholesterol, Total: 211 mg/dL — ABNORMAL HIGH (ref 100–199)
HDL: 51 mg/dL (ref >39)
LDL Chol Calc (NIH): 131 mg/dL — ABNORMAL HIGH (ref 0–99)
Triglycerides: 164 mg/dL — ABNORMAL HIGH (ref 0–149)
VLDL Cholesterol Cal: 29 mg/dL (ref 5–40)

## 2022-07-26 LAB — PSA, TOTAL AND FREE
PSA, Free Pct: 52 %
PSA, Free: 0.26 ng/mL
Prostate Specific Ag, Serum: 0.5 ng/mL (ref 0.0–4.0)

## 2022-07-28 ENCOUNTER — Telehealth: Payer: Self-pay | Admitting: Nurse Practitioner

## 2022-07-28 NOTE — Telephone Encounter (Signed)
Pt aware of results. Pt asking if there is an alternative to crestor. States he is not sure if it is crestor or his blood pressure medicine that is making his muscles feel weak.

## 2022-07-28 NOTE — Telephone Encounter (Signed)
Have patient make appointment with Harold Bullock to discuss repatha

## 2022-07-28 NOTE — Telephone Encounter (Signed)
Patient does not want to see pharmacist at this time. He states that he hasn't been taking his cholesterol med in a few months. Thinks his weakness and feeling bad may be coming from his BP med. Wants to stop his BP med and restart his cholesterol and see how that goes. Advised patient that wasn't the best idea and he needs to be very careful with stopping his BP med and he needs to monitor his BP very carefully. Also advised that he needs to follow up with cardiology and he doesn't want to do that at this time. Advised patient to contact the office and update [place] on how things are going. Patient verbalized understanding

## 2022-08-04 ENCOUNTER — Ambulatory Visit (HOSPITAL_BASED_OUTPATIENT_CLINIC_OR_DEPARTMENT_OTHER)
Admission: RE | Admit: 2022-08-04 | Discharge: 2022-08-04 | Disposition: A | Discharge status: Home / Self Care | Payer: BC Managed Care – PPO | Source: Ambulatory Visit | Attending: Nurse Practitioner | Admitting: Nurse Practitioner

## 2022-08-04 DIAGNOSIS — R918 Other nonspecific abnormal finding of lung field: Secondary | ICD-10-CM | POA: Insufficient documentation

## 2022-10-27 ENCOUNTER — Telehealth: Payer: Self-pay | Admitting: Nurse Practitioner

## 2022-11-11 ENCOUNTER — Other Ambulatory Visit: Payer: Self-pay | Admitting: Nurse Practitioner

## 2022-11-11 DIAGNOSIS — Z1212 Encounter for screening for malignant neoplasm of rectum: Secondary | ICD-10-CM

## 2022-11-11 DIAGNOSIS — Z1211 Encounter for screening for malignant neoplasm of colon: Secondary | ICD-10-CM

## 2022-12-06 LAB — COLOGUARD: COLOGUARD: POSITIVE — AB

## 2022-12-06 NOTE — Addendum Note (Signed)
Addended by: Bennie Pierini on: 12/06/2022 11:25 AM   Modules accepted: Orders

## 2023-01-11 ENCOUNTER — Ambulatory Visit (AMBULATORY_SURGERY_CENTER): Payer: BC Managed Care – PPO

## 2023-01-11 VITALS — Ht 70.0 in | Wt 210.0 lb

## 2023-01-11 DIAGNOSIS — Z1211 Encounter for screening for malignant neoplasm of colon: Secondary | ICD-10-CM

## 2023-01-11 MED ORDER — NA SULFATE-K SULFATE-MG SULF 17.5-3.13-1.6 GM/177ML PO SOLN: 1.0000 | Freq: Once | ORAL | 0 refills | Status: AC

## 2023-01-11 NOTE — Progress Notes (Signed)
No egg or soy allergy known to patient  No issues known to pt with past sedation with any surgeries or procedures Patient denies ever being told they had issues or difficulty with intubation  No FH of Malignant Hyperthermia Pt is not on diet pills Pt is not on home 02  Pt is not on blood thinners  Pt denies issues with chronic constipation  No A fib or A flutter Have any cardiac testing pending--no Pt instructed to use [email or web address] or GoodRx for a price reduction on prep  Ambulates independently

## 2023-01-12 ENCOUNTER — Encounter: Payer: Self-pay | Admitting: Gastroenterology

## 2023-01-21 IMAGING — CT CT CHEST W/O CM
2 of 4 series · 15 of 36 positions shown, 18 images · non-contrast
Comparison: CT heart 11/25/2020.

CLINICAL DATA: History of pulmonary nodule. High risk. Follow-up
exam.



[Series 2: routine chest without · axial · non-contrast · 0.80mm/px · z∈[+872,+1128]mm · 12 of 152 slices shown, 15 images]
[im 12/152  mediastinal]
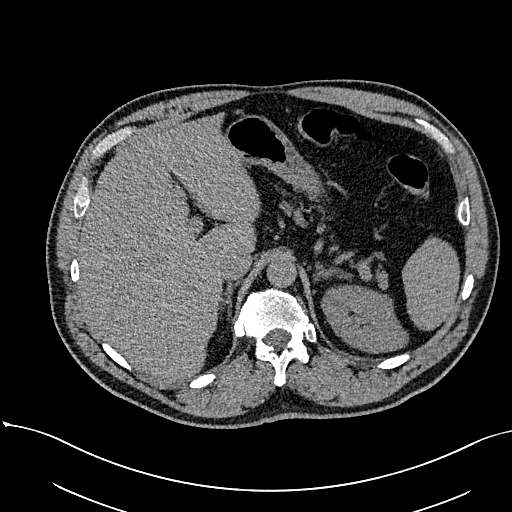
[im 12/152  lung]
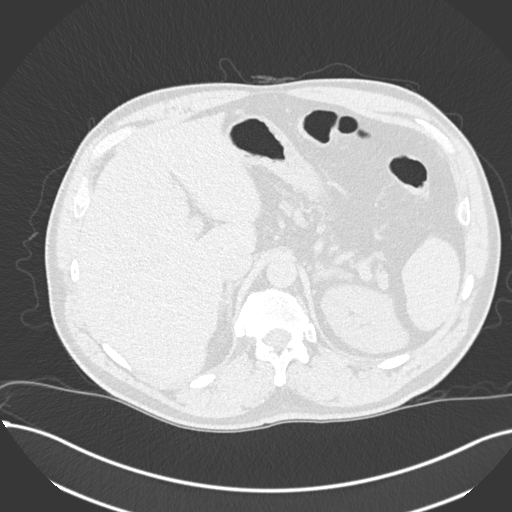
[im 24/152  lung]
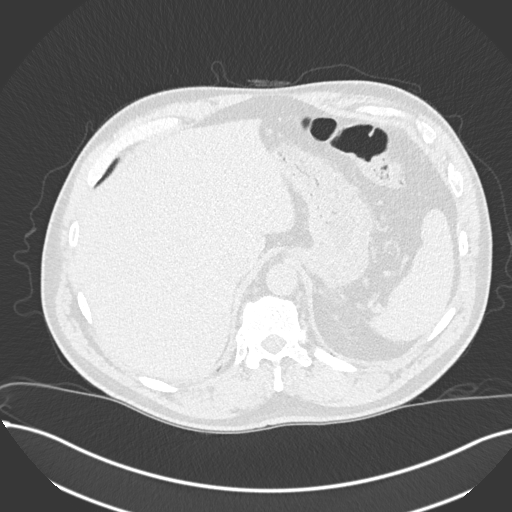
[im 35/152  lung]
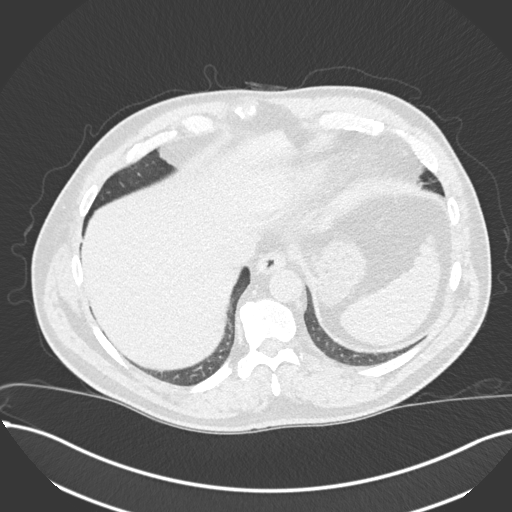
[im 47/152  lung]
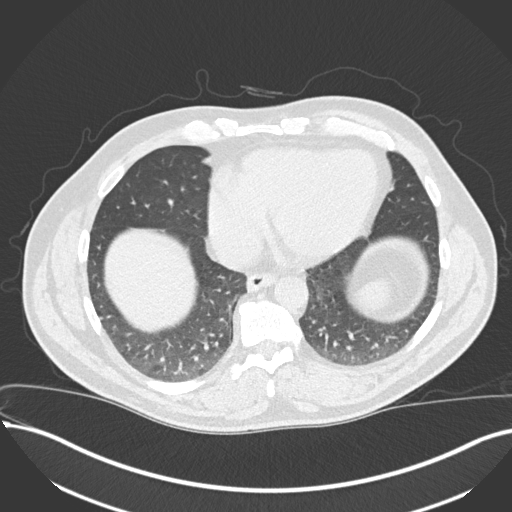
[im 59/152  mediastinal]
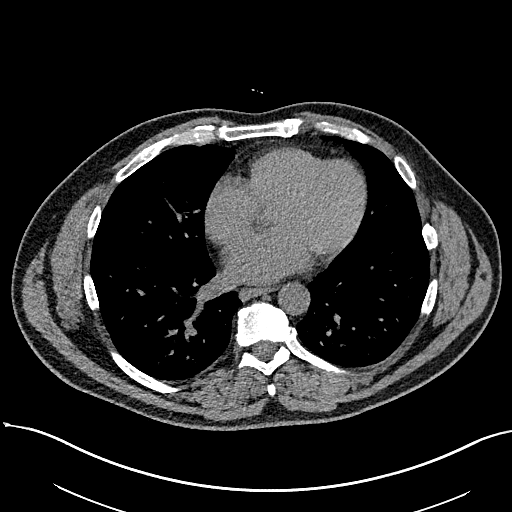
[im 59/152  lung]
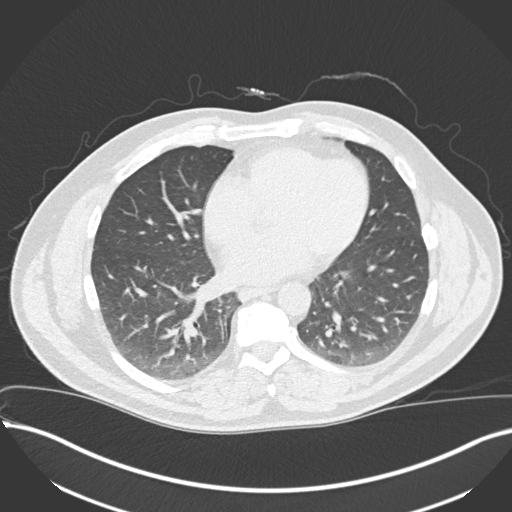
[im 70/152  lung]
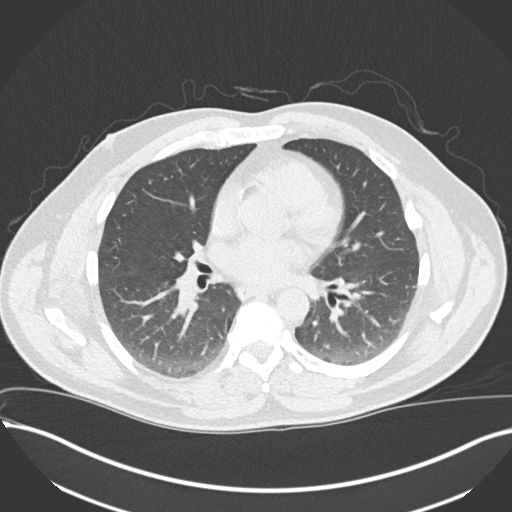
[im 82/152  lung]
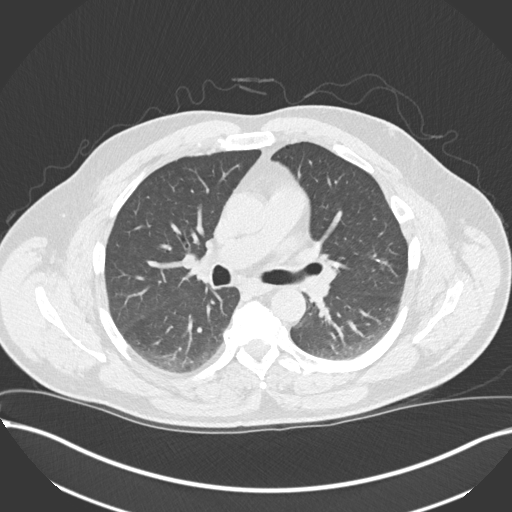
[im 93/152  lung]
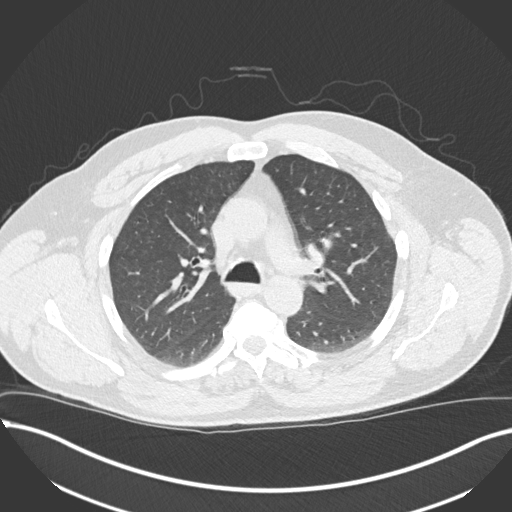
[im 105/152  mediastinal]
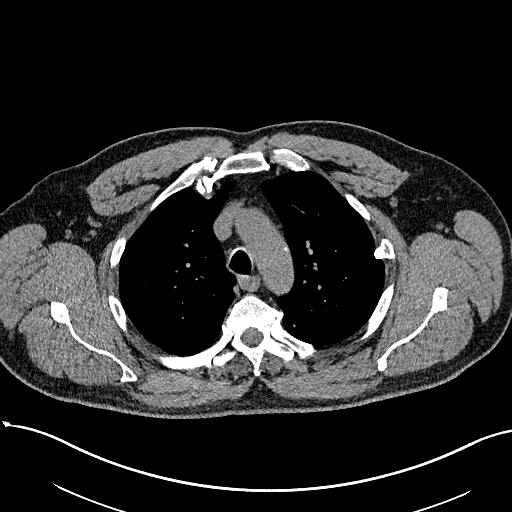
[im 105/152  lung]
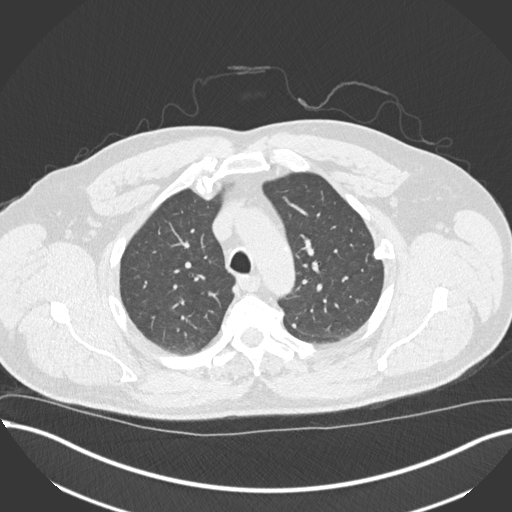
[im 117/152  lung]
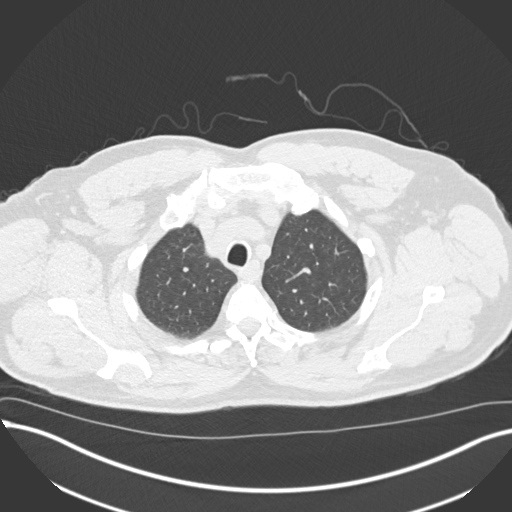
[im 128/152  lung]
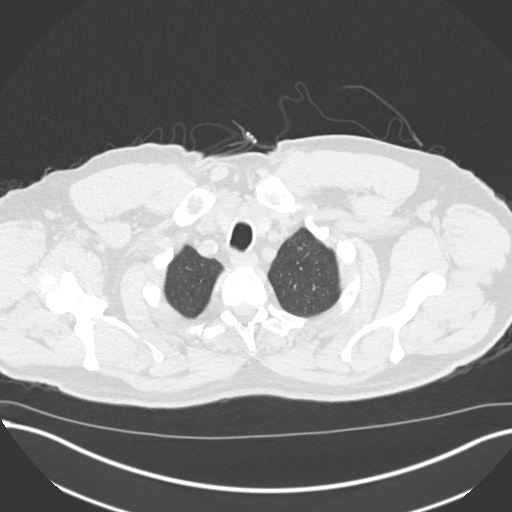
[im 140/152  lung]
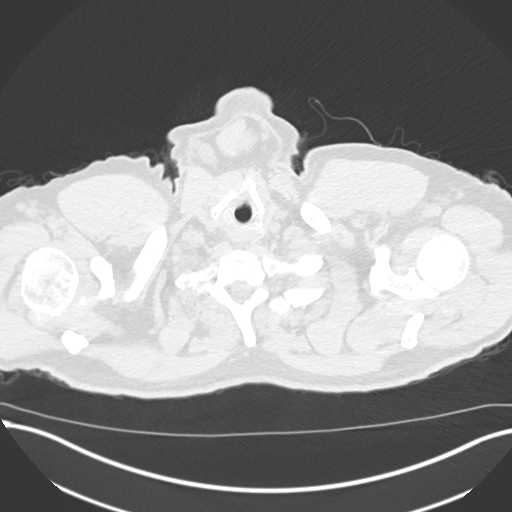

[Series 5: coronal · coronal · 0.59mm/px · 3 of 150 slices shown]
[im 30/150  lung]
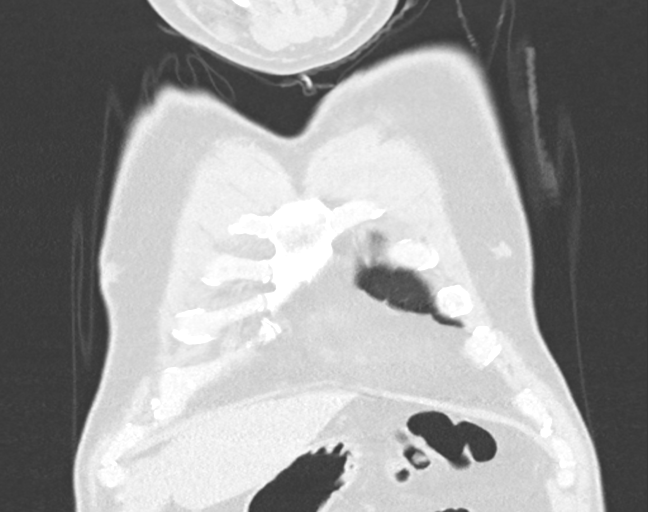
[im 60/150  lung]
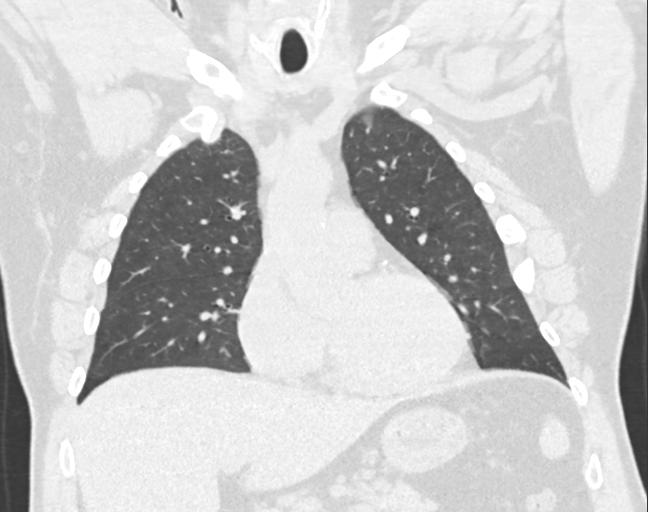
[im 90/150  lung]
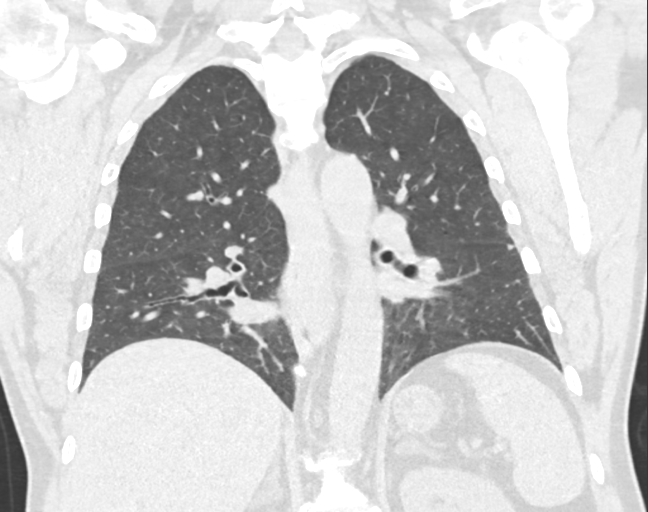

[15 of 36 positions shown; findings below may reference images not displayed]

FINDINGS: Cardiovascular: Normal heart size. Thoracic aortic and coronary
arterial vascular calcifications. No pericardial effusion.

Mediastinum/Nodes: No enlarged axillary, mediastinal or hilar
lymphadenopathy. Normal appearance of the esophagus.

Lungs/Pleura: Central airways are patent. Dependent atelectasis
within the bilateral lower lobes. No large area pulmonary
consolidation. 4 mm left upper lobe nodule (image 48; series 4).
Stable 3 mm subpleural anterior right upper lobe nodule (image 74;
series 4). Stable 5 mm right middle lobe nodule (image 87; series
4). 5 mm right upper lobe nodule (image 65; series 4) not included
on prior exam. No pleural effusion or pneumothorax. 3 mm subpleural
left lower lobe nodule (image 74; series 4). 2 mm subpleural left
upper lobe nodule (image 40; series 4).

Upper Abdomen: The liver is normal in size and contour. Mild
thickening left adrenal gland.

Musculoskeletal: Thoracic spine degenerative changes. No aggressive
or acute appearing osseous lesions.
IMPRESSION: Multiple pulmonary nodules as described above. Not all of these
pulmonary nodules were included on prior CT given non inclusion of
the entire chest. The previously visualized nodules are stable. The
largest nodule not visualized on prior CT measures 5 mm. No
follow-up needed if patient is low-risk (and has no known or
suspected primary neoplasm). Non-contrast chest CT can be considered
in 12 months if patient is high-risk. This recommendation follows
the consensus statement: Guidelines for Management of Incidental
Pulmonary Nodules Detected on CT Images: From the [HOSPITAL]

Aortic Atherosclerosis ([id number]-[id number].H).

## 2023-01-24 ENCOUNTER — Encounter: Payer: BC Managed Care – PPO | Admitting: Gastroenterology

## 2023-01-30 ENCOUNTER — Ambulatory Visit (INDEPENDENT_AMBULATORY_CARE_PROVIDER_SITE_OTHER): Payer: BC Managed Care – PPO | Admitting: Nurse Practitioner

## 2023-01-30 ENCOUNTER — Encounter: Payer: Self-pay | Admitting: Nurse Practitioner

## 2023-01-30 VITALS — BP 142/80 | HR 82 | Temp 98.2°F | Ht 70.0 in | Wt 210.0 lb

## 2023-01-30 DIAGNOSIS — I2510 Atherosclerotic heart disease of native coronary artery without angina pectoris: Secondary | ICD-10-CM

## 2023-01-30 DIAGNOSIS — E119 Type 2 diabetes mellitus without complications: Secondary | ICD-10-CM

## 2023-01-30 DIAGNOSIS — G4733 Obstructive sleep apnea (adult) (pediatric): Secondary | ICD-10-CM

## 2023-01-30 DIAGNOSIS — I10 Essential (primary) hypertension: Secondary | ICD-10-CM | POA: Diagnosis not present

## 2023-01-30 DIAGNOSIS — E785 Hyperlipidemia, unspecified: Secondary | ICD-10-CM | POA: Diagnosis not present

## 2023-01-30 DIAGNOSIS — E66811 Obesity, class 1: Secondary | ICD-10-CM

## 2023-01-30 LAB — BAYER DCA HB A1C WAIVED: HB A1C (BAYER DCA - WAIVED): 5.7 % — ABNORMAL HIGH (ref 4.8–5.6)

## 2023-01-30 NOTE — Progress Notes (Signed)
Subjective:    Patient ID: Harold Bullock, male    DOB: 1965-12-29, 57 y.o.   MRN: [id number]   Chief Complaint: medical management of chronic issues     HPI:  Harold Bullock is a 57 y.o. who identifies as a male who was assigned male at birth.   Social history: Lives with: wife   Comes in today for follow up of the following chronic medical issues:  1. Primary hypertension No c/o chest pain, sob or headache. Does not check blood pressure at home. BP Readings from Last 3 Encounters:  07/25/22 134/79  04/25/22 (!) 169/84  01/06/22 132/71     2. Hyperlipidemia with target LDL less than 70 Hs been watching diet and stays very active. Is on crestor daily Lab Results  Component Value Date   CHOL 211 (H) 07/25/2022   HDL 51 07/25/2022   LDLCALC 131 (H) 07/25/2022   TRIG 164 (H) 07/25/2022   CHOLHDL 4.1 07/25/2022   The 10-year ASCVD risk score (Arnett DK, et al., 2019) is: 23.6%   3. Diet-controlled diabetes mellitus (HCC) His blood sugaras have been good when he checks them. Does not check every day Lab Results  Component Value Date   HGBA1C 5.7 (H) 07/25/2022     4. Screening for prostate cancer No voiding issues Lab Results  Component Value Date   PSA1 0.5 07/25/2022   PSA1 0.4 04/23/2021   PSA1 0.5 02/13/2017      5. Agatston CAC score 200-399 6. Coronary artery disease, non-occlusive 7. PFO (patent foramen ovale) Last saw cardiology on 07/28/21. Only change made to plan of care was increase in crestor to 40mg  daily  8. Pulmonary nodule, right We are watching pulmonary nodule. Repeat was due in May but was not scheduled  9. OSA (obstructive sleep apnea) Wears cpap nightly  10. Obesity (BMI 30.0-34.9) Weight is down 14 lbs  Wt Readings from Last 3 Encounters:  01/30/23 210 lb (95.3 kg)  01/11/23 210 lb (95.3 kg)  07/25/22 206 lb (93.4 kg)   BMI Readings from Last 3 Encounters:  01/30/23 30.13 kg/m  01/11/23 30.13 kg/m  07/25/22  29.56 kg/m       New complaints: None today  Allergies  Allergen Reactions   Poison Oak Extract Rash   Outpatient Encounter Medications as of 01/30/2023  Medication Sig   aspirin EC 81 MG tablet Take 1 tablet (81 mg total) by mouth daily. Swallow whole.   Blood Glucose Monitoring Suppl (ONETOUCH VERIO REFLECT) w/Device KIT Test BS up to 4 times daily Dx R73.9 (Patient not taking: Reported on 01/11/2023)   glucose blood (ONETOUCH VERIO) test strip Use up to four times a day as directed Dx R73.9 (Patient not taking: Reported on 01/11/2023)   Lancets (ONETOUCH DELICA PLUS LANCET33G) MISC Test BS up to 4 times daily Dx R73.9 (Patient not taking: Reported on 01/11/2023)   lisinopril (ZESTRIL) 20 MG tablet Take 1 tablet (20 mg total) by mouth daily. (Patient not taking: Reported on 01/11/2023)   naproxen (NAPROSYN) 500 MG tablet Take 500 mg by mouth.   rosuvastatin (CRESTOR) 20 MG tablet Take 1 tablet (20 mg total) by mouth daily. (Patient not taking: Reported on 01/11/2023)   No facility-administered encounter medications on file as of 01/30/2023.    Past Surgical History:  Procedure Laterality Date   BACK SURGERY  2005     Family History  Problem Relation Age of Onset   Alzheimer's disease Mother    Prostate  cancer Maternal Uncle    Cancer Maternal Uncle    Cancer Maternal Grandfather    Bone cancer Maternal Grandfather    Cancer Paternal Grandmother    Colon cancer Neg Hx    Esophageal cancer Neg Hx    Rectal cancer Neg Hx    Stomach cancer Neg Hx    Colon polyps Neg Hx       Controlled substance contract: n/a     Review of Systems  Constitutional:  Negative for diaphoresis.  Eyes:  Negative for pain.  Respiratory:  Negative for shortness of breath.   Cardiovascular:  Negative for chest pain, palpitations and leg swelling.  Gastrointestinal:  Negative for abdominal pain.  Endocrine: Negative for polydipsia.  Skin:  Negative for rash.  Neurological:  Negative for  dizziness, weakness and headaches.  Hematological:  Does not bruise/bleed easily.  All other systems reviewed and are negative.      Objective:   Physical Exam Vitals and nursing note reviewed.  Constitutional:      Appearance: Normal appearance. He is well-developed.  HENT:     Head: Normocephalic.     Nose: Nose normal.     Mouth/Throat:     Mouth: Mucous membranes are moist.     Pharynx: Oropharynx is clear.  Eyes:     Pupils: Pupils are equal, round, and reactive to light.  Neck:     Thyroid: No thyroid mass or thyromegaly.     Vascular: No carotid bruit or JVD.     Trachea: Phonation normal.  Cardiovascular:     Rate and Rhythm: Normal rate and regular rhythm.  Pulmonary:     Effort: Pulmonary effort is normal. No respiratory distress.     Breath sounds: Normal breath sounds.  Abdominal:     General: Bowel sounds are normal.     Palpations: Abdomen is soft.     Tenderness: There is no abdominal tenderness.  Musculoskeletal:        General: Normal range of motion.     Cervical back: Normal range of motion and neck supple.  Lymphadenopathy:     Cervical: No cervical adenopathy.  Skin:    General: Skin is warm and dry.  Neurological:     Mental Status: He is alert and oriented to person, place, and time.  Psychiatric:        Behavior: Behavior normal.        Thought Content: Thought content normal.        Judgment: Judgment normal.     BP (!) 142/80   Pulse 82   Temp 98.2 F (36.8 C) (Temporal)   Ht 5\' 10"  (1.778 m)   Wt 210 lb (95.3 kg)   SpO2 98%   BMI 30.13 kg/m     HGBA1c 5.7%      Assessment & Plan:  Harold Bullock comes in today with chief complaint of No chief complaint on file.   Diagnosis and orders addressed:  1. Primary hypertension Low sodium diet - CBC with Differential/Platelet - CMP14+EGFR - lisinopril (ZESTRIL) 20 MG tablet; Take 1 tablet (20 mg total) by mouth daily.  Dispense: 90 tablet; Refill: 1  2. Hyperlipidemia  with target LDL less than 70 Low fat diet - Lipid panel - rosuvastatin (CRESTOR) 20 MG tablet; Take 1 tablet (20 mg total) by mouth daily.  Dispense: 90 tablet; Refill: 1  3. Diet-controlled diabetes mellitus (HCC) Continue to watch diet - Bayer DCA Hb A1c Waived  4. Screening for prostate  cancer Labs pending - PSA, total and free  5. Agatston CAC score 200-399 6. Coronary artery disease, non-occlusive 7. PFO (patent foramen ovale) Keep follow up with cardiology  8. Pulmonary nodule, right Will find out why chest CT has no t been schedyuled yet  9. OSA (obstructive sleep apnea) Continue  to wear cpap at night  10. Obesity (BMI 30.0-34.9) Continue to watch diet and exercise   Labs pending Health Maintenance reviewed Diet and exercise encouraged  Follow up plan: 6 months   [name] [name], FNP

## 2023-01-30 NOTE — Patient Instructions (Signed)

## 2023-01-31 LAB — CBC WITH DIFFERENTIAL/PLATELET
Basophils Absolute: 0 10*3/uL (ref 0.0–0.2)
Basos: 1 %
EOS (ABSOLUTE): 0.1 10*3/uL (ref 0.0–0.4)
Eos: 2 %
Hematocrit: 42.5 % (ref 37.5–51.0)
Hemoglobin: 14.7 g/dL (ref 13.0–17.7)
Immature Grans (Abs): 0 10*3/uL (ref 0.0–0.1)
Immature Granulocytes: 0 %
Lymphocytes Absolute: 1.2 10*3/uL (ref 0.7–3.1)
Lymphs: 22 %
MCH: 34.8 pg — ABNORMAL HIGH (ref 26.6–33.0)
MCHC: 34.6 g/dL (ref 31.5–35.7)
MCV: 101 fL — ABNORMAL HIGH (ref 79–97)
Monocytes Absolute: 0.5 10*3/uL (ref 0.1–0.9)
Monocytes: 8 %
Neutrophils Absolute: 3.7 10*3/uL (ref 1.4–7.0)
Neutrophils: 67 %
Platelets: 191 10*3/uL (ref 150–450)
RBC: 4.22 x10E6/uL (ref 4.14–5.80)
RDW: 11.9 % (ref 11.6–15.4)
WBC: 5.5 10*3/uL (ref 3.4–10.8)

## 2023-01-31 LAB — CMP14+EGFR
ALT: 26 [IU]/L (ref 0–44)
AST: 21 [IU]/L (ref 0–40)
Albumin: 4.6 g/dL (ref 3.8–4.9)
Alkaline Phosphatase: 94 [IU]/L (ref 44–121)
BUN/Creatinine Ratio: 18 (ref 9–20)
BUN: 18 mg/dL (ref 6–24)
Bilirubin Total: 0.4 mg/dL (ref 0.0–1.2)
CO2: 26 mmol/L (ref 20–29)
Calcium: 9.9 mg/dL (ref 8.7–10.2)
Chloride: 100 mmol/L (ref 96–106)
Creatinine, Ser: 0.98 mg/dL (ref 0.76–1.27)
Globulin, Total: 2.4 g/dL (ref 1.5–4.5)
Glucose: 142 mg/dL — ABNORMAL HIGH (ref 70–99)
Potassium: 4.2 mmol/L (ref 3.5–5.2)
Sodium: 140 mmol/L (ref 134–144)
Total Protein: 7 g/dL (ref 6.0–8.5)
eGFR: 89 mL/min/{1.73_m2} (ref >59)

## 2023-01-31 LAB — LIPID PANEL
Chol/HDL Ratio: 4.3 {ratio} (ref 0.0–5.0)
Cholesterol, Total: 235 mg/dL — ABNORMAL HIGH (ref 100–199)
HDL: 55 mg/dL (ref >39)
LDL Chol Calc (NIH): 137 mg/dL — ABNORMAL HIGH (ref 0–99)
Triglycerides: 241 mg/dL — ABNORMAL HIGH (ref 0–149)
VLDL Cholesterol Cal: 43 mg/dL — ABNORMAL HIGH (ref 5–40)

## 2023-02-13 ENCOUNTER — Ambulatory Visit (INDEPENDENT_AMBULATORY_CARE_PROVIDER_SITE_OTHER): Payer: BC Managed Care – PPO | Admitting: Family Medicine

## 2023-02-13 ENCOUNTER — Telehealth: Payer: Self-pay | Admitting: Gastroenterology

## 2023-02-13 ENCOUNTER — Ambulatory Visit: Payer: Self-pay | Admitting: Nurse Practitioner

## 2023-02-13 ENCOUNTER — Encounter: Payer: Self-pay | Admitting: Family Medicine

## 2023-02-13 VITALS — BP 147/75 | HR 88 | Ht 70.0 in | Wt 210.0 lb

## 2023-02-13 DIAGNOSIS — M25562 Pain in left knee: Secondary | ICD-10-CM

## 2023-02-13 MED ORDER — METHYLPREDNISOLONE ACETATE 80 MG/ML IJ SUSP
80.0000 mg | Freq: Once | INTRAMUSCULAR | Status: AC
  Administered 2023-02-13: 80 mg via INTRA_ARTICULAR

## 2023-02-13 NOTE — Telephone Encounter (Signed)
 Moved patient to the 2:30 pm appt time and left VM with that information.

## 2023-02-13 NOTE — Progress Notes (Signed)
 BP (!) 147/75   Pulse 88   Ht 5' 10 (1.778 m)   Wt 210 lb (95.3 kg)   SpO2 99%   BMI 30.13 kg/m    Subjective:   Patient ID: Harold Bullock, male    DOB: 1965-03-31, 58 y.o.   MRN: [id number]  HPI: Harold Bullock is a 58 y.o. male presenting on 02/13/2023 for Knee Pain (Left. Swelling. Started over the weekend after working on tractor.)   HPI Knee pain Pain is 5/10 and started to flare up 3 days ago.  He was working on the tractor this weekend. He feels like it is swollen in the middle of his knee.  He is able to weight bear but is swollen. He has taken tylenol  and it helped some. He has iced and it did not change things. He denies any specific fall or trauma.  Relevant past medical, surgical, family and social history reviewed and updated as indicated. Interim medical history since our last visit reviewed. Allergies and medications reviewed and updated.  Review of Systems  Constitutional:  Negative for chills and fever.  Eyes:  Negative for visual disturbance.  Respiratory:  Negative for shortness of breath and wheezing.   Cardiovascular:  Negative for chest pain and leg swelling.  Musculoskeletal:  Positive for arthralgias and joint swelling. Negative for back pain and gait problem.  Skin:  Negative for rash.  All other systems reviewed and are negative.   Per HPI unless specifically indicated above   Allergies as of 02/13/2023       Reactions   Poison Oak Extract Rash        Medication List        Accurate as of February 13, 2023  2:21 PM. If you have any questions, ask your nurse or doctor.          aspirin  EC 81 MG tablet Take 1 tablet (81 mg total) by mouth daily. Swallow whole.   FISH OIL PO Take by mouth.   lisinopril  20 MG tablet Commonly known as: ZESTRIL  Take 1 tablet (20 mg total) by mouth daily.   naproxen  500 MG tablet Commonly known as: NAPROSYN  Take 500 mg by mouth.   OneTouch Delica Plus Lancet33G Misc Test BS up to 4 times daily  Dx R73.9   OneTouch Verio Reflect w/Device Kit Test BS up to 4 times daily Dx R73.9   OneTouch Verio test strip Generic drug: glucose blood Use up to four times a day as directed Dx R73.9   OVER THE COUNTER MEDICATION Total Beets   rosuvastatin  20 MG tablet Commonly known as: CRESTOR  Take 1 tablet (20 mg total) by mouth daily.   VITAMIN D PO Take by mouth.         Objective:   BP (!) 147/75   Pulse 88   Ht 5' 10 (1.778 m)   Wt 210 lb (95.3 kg)   SpO2 99%   BMI 30.13 kg/m   Wt Readings from Last 3 Encounters:  02/13/23 210 lb (95.3 kg)  01/30/23 210 lb (95.3 kg)  01/11/23 210 lb (95.3 kg)    Physical Exam Vitals and nursing note reviewed.  Constitutional:      Appearance: Normal appearance.  Musculoskeletal:     Left knee: Effusion present. No swelling, erythema, bony tenderness or crepitus. Normal range of motion. Tenderness present over the medial joint line. No LCL laxity, MCL laxity, ACL laxity or PCL laxity.Normal meniscus.  Neurological:     Mental  Status: He is alert.    Knee injection: Consent form signed. Risk factors of bleeding and infection discussed with patient and patient is agreeable towards injection. Patient prepped with Betadine. Lateral approach towards injection used. Injected 80 mg of Depo-Medrol  and 1 mL of 2% lidocaine. Patient tolerated procedure well and no side effects from noted. Minimal to no bleeding. Simple bandage applied after.    Assessment & Plan:   Problem List Items Addressed This Visit   None Visit Diagnoses       Acute pain of left knee    -  Primary   Relevant Medications   methylPREDNISolone  acetate (DEPO-MEDROL ) injection 80 mg (Start on 02/13/2023  2:30 PM)      Likely flare up of osteoarthritis, called gastroenterologist and they said they were fine with steroid injection today.  Follow up plan: Return if symptoms worsen or fail to improve.  Counseling provided for all of the vaccine components No orders of  the defined types were placed in this encounter.   Fonda Levins, MD [facility] 02/13/2023, 2:21 PM

## 2023-02-13 NOTE — Telephone Encounter (Signed)
  Chief Complaint: left knee pain and swelling Symptoms: left knee moderate pain and slight swelling Frequency: constant since Saturday Pertinent Negatives: Patient denies redness, fevers, chest pain, SOB, radiating pain, calf pain Disposition: [] ED /[] Urgent Care (no appt availability in office) / [x] Appointment(In office/virtual)/ []  [facility]/ [] Home Care/ [] Refused Recommended Disposition /[] [facility]/ []  Follow-up with PCP Additional Notes: Patient states he was on his knees for 2-3 hours on Saturday working on his tractor and since left knee pain and swelling. Patient states he has received a cortisone shot in the past and feels it is at that point. Patient on his way now for 1:10pm appointment.  Copied from CRM [id number]. Topic: Clinical - Red Word Triage >> Feb 13, 2023 12:38 PM Laurier BROCKS wrote: Red Word that prompted transfer to Nurse Triage: Left knee pain. Patient was working on his tractor on his knees and has been in pain. With swelling Reason for Disposition  [1] MODERATE pain (e.g., interferes with normal activities, limping) AND [2] present > 3 days  Answer Assessment - Initial Assessment Questions 1. LOCATION and RADIATION: Where is the pain located?      Left knee.  2. QUALITY: What does the pain feel like?  (e.g., sharp, dull, aching, burning)     Sharp and sore.  3. SEVERITY: How bad is the pain? What does it keep you from doing?   (Scale 1-10; or mild, moderate, severe)   -  MILD (1-3): doesn't interfere with normal activities    -  MODERATE (4-7): interferes with normal activities (e.g., work or school) or awakens from sleep, limping    -  SEVERE (8-10): excruciating pain, unable to do any normal activities, unable to walk     5-6/10.  4. ONSET: When did the pain start? Does it come and go, or is it there all the time?     Saturday. Constant pain.  5. RECURRENT: Have you had this pain before? If Yes, ask: When, and what  happened then?     Patient states he has a history of pain in his knees. Sometimes it goes as quick as it comes and states he has required a cortisone shot in the past and feels like he may need one.  6. SETTING: Has there been any recent work, exercise or other activity that involved that part of the body?      Saturday was working on his tractor for 2-3 hours while bent on his knees.  7. AGGRAVATING FACTORS: What makes the knee pain worse? (e.g., walking, climbing stairs, running)     Walking, ice, heating pad, biofreeze help some with the pain.  8. ASSOCIATED SYMPTOMS: Is there any swelling or redness of the knee?     Slight swelling in left knee.  9. OTHER SYMPTOMS: Do you have any other symptoms? (e.g., chest pain, difficulty breathing, fever, calf pain)     Denies.  Protocols used: Knee Pain-A-AH

## 2023-02-13 NOTE — Progress Notes (Signed)
 Subjective: Harold Bullock is a 58 year old male that presents to the office on 02/13/2023 with left knee pain and swelling for 3 days. States he was working on his tractor  over the weekend. Reports he has similar pain before that usually goes away after a few hours. He is able to bear weight but is swollen. Pain is 5 out of 10. He has taken aspirin  with minimal relief. Avoiding further meds due to colonoscopy scheduled for tomorrow. Has used ice but no change. No trauma or fall reported. Denies any changes in sensation or instability.   PMH positive for chronic left knee pain, OSA, and HTN. Surgical, family and social history reviewed. No allergies noted.  ROS: MSK: Positive for swelling in pain in left knee  Current Medications reviewed.  Objective: Vitals are HR 88, BP 147/75, RR 16, Ht 5/10, Wt 95.3 kg, SpO2 99% on RA, BMI 30.13  Gen: Patient is A/O x4/4, no acute distress noted. Appears well. MSK: Left knee: Noted mild swelling and tenderness present on the medial aspect. Normal ROM. No erythema or lesions noted. No ligament instability noted.  Assessment: Primary problem is osteoarthritis in the left knee due to current symptoms. Also has a history of chronic left knee pain. No ligament issues of concern.  Plan: Depo-Medrol  80 mg with 2 cc's lidocaine 2% without epi injected in the left knee. Follow up if any symptoms worsen or do not improve  Patient seen and examined with PA student.  Agree with assessment and plan above Agree with assessment and plan above Fonda Levins, MD [facility] 02/15/2023, 8:04 AM

## 2023-02-13 NOTE — Telephone Encounter (Signed)
 Inbound call from patient's wife requesting to know if patient is able to come in at a earlier time for colonoscopy tomorrow 02/14/23. Please advise, thank you.

## 2023-02-14 ENCOUNTER — Encounter: Payer: Self-pay | Admitting: Gastroenterology

## 2023-02-14 ENCOUNTER — Ambulatory Visit: Payer: BC Managed Care – PPO | Admitting: Gastroenterology

## 2023-02-14 VITALS — BP 133/82 | HR 68 | Temp 98.0°F | Resp 16 | Ht 70.0 in | Wt 210.0 lb

## 2023-02-14 DIAGNOSIS — D124 Benign neoplasm of descending colon: Secondary | ICD-10-CM | POA: Diagnosis not present

## 2023-02-14 DIAGNOSIS — D128 Benign neoplasm of rectum: Secondary | ICD-10-CM

## 2023-02-14 DIAGNOSIS — K640 First degree hemorrhoids: Secondary | ICD-10-CM

## 2023-02-14 DIAGNOSIS — D127 Benign neoplasm of rectosigmoid junction: Secondary | ICD-10-CM

## 2023-02-14 DIAGNOSIS — K635 Polyp of colon: Secondary | ICD-10-CM | POA: Diagnosis not present

## 2023-02-14 DIAGNOSIS — Z1211 Encounter for screening for malignant neoplasm of colon: Secondary | ICD-10-CM | POA: Diagnosis present

## 2023-02-14 DIAGNOSIS — D120 Benign neoplasm of cecum: Secondary | ICD-10-CM | POA: Diagnosis not present

## 2023-02-14 DIAGNOSIS — R195 Other fecal abnormalities: Secondary | ICD-10-CM | POA: Diagnosis not present

## 2023-02-14 DIAGNOSIS — D125 Benign neoplasm of sigmoid colon: Secondary | ICD-10-CM

## 2023-02-14 HISTORY — PX: COLONOSCOPY: SHX174

## 2023-02-14 MED ORDER — SODIUM CHLORIDE 0.9 % IV SOLN: 500.0000 mL | Freq: Once | INTRAVENOUS | Status: AC

## 2023-02-14 NOTE — Progress Notes (Signed)
 GASTROENTEROLOGY PROCEDURE H&P NOTE   Primary Care Physician: Gladis Mustard, FNP    Reason for Procedure:  Colon Cancer screening  Plan:    Colonoscopy  Patient is appropriate for endoscopic procedure(s) in the ambulatory ([facility]) setting.  The nature of the procedure, as well as the risks, benefits, and alternatives were carefully and thoroughly reviewed with the patient. Ample time for discussion and questions allowed. The patient understood, was satisfied, and agreed to proceed.     HPI: Harold Bullock is a 58 y.o. male who presents for colonoscopy for routine Colon Cancer screening.  No active GI symptoms.  No known family history of colon cancer or related malignancy.  Patient is otherwise without complaints or active issues today.  Past Medical History:  Diagnosis Date   Coronary artery disease, non-occlusive 12/11/2020   Coronary calcium  score 243.  Minimal-mild nonobstructive CAD (CAD RADS 2.  Mid LAD bridging segment noted.  Minimal (<24%) proximal RCA and left main.  Minimal proximal LAD and small D1.  Mild(25-49%) proximal LCx.  Several OM branches without significant disease.   Essential hypertension    Hyperlipidemia due to dietary fat intake 09/08/2020   TC 207, TG 242, HDL 46, LDL 119.   Metabolic syndrome    Obesity, hypertension, hypertriglyceridemia.   Obese    OSA (obstructive sleep apnea) 05/21/2015   Sleep apnea     Past Surgical History:  Procedure Laterality Date   BACK SURGERY  2005     Prior to Admission medications   Medication Sig Start Date End Date Taking? Authorizing Provider  aspirin  EC 81 MG tablet Take 1 tablet (81 mg total) by mouth daily. Swallow whole. 07/28/21  Yes Anner Alm ORN, MD  OVER THE COUNTER MEDICATION Total Beets   Yes [provider]  lisinopril  (ZESTRIL ) 20 MG tablet Take 1 tablet (20 mg total) by mouth daily. Patient not taking: Reported on 02/14/2023 07/25/22   Gladis Mustard, FNP  naproxen   (NAPROSYN ) 500 MG tablet Take 500 mg by mouth. 01/02/22   [provider]  Omega-3 Fatty Acids (FISH OIL PO) Take by mouth.    [provider]  VITAMIN D PO Take by mouth.    [provider]    Current Outpatient Medications  Medication Sig Dispense Refill   aspirin  EC 81 MG tablet Take 1 tablet (81 mg total) by mouth daily. Swallow whole. 90 tablet 3   OVER THE COUNTER MEDICATION Total Beets     lisinopril  (ZESTRIL ) 20 MG tablet Take 1 tablet (20 mg total) by mouth daily. (Patient not taking: Reported on 02/14/2023) 90 tablet 1   naproxen  (NAPROSYN ) 500 MG tablet Take 500 mg by mouth.     Omega-3 Fatty Acids (FISH OIL PO) Take by mouth.     VITAMIN D PO Take by mouth.     Current Facility-Administered Medications  Medication Dose Route Frequency Provider Last Rate Last Admin   0.9 %  sodium chloride  infusion  500 mL Intravenous Once Lanier Millon V, DO        Allergies as of 02/14/2023 - Review Complete 02/14/2023  Allergen Reaction Noted   Poison oak extract Rash 01/11/2023    Family History  Problem Relation Age of Onset   Alzheimer's disease Mother    Prostate cancer Maternal Uncle    Cancer Maternal Uncle    Cancer Maternal Grandfather    Bone cancer Maternal Grandfather    Cancer Paternal Grandmother    Colon cancer Neg Hx  Esophageal cancer Neg Hx    Rectal cancer Neg Hx    Stomach cancer Neg Hx    Colon polyps Neg Hx     Social History   Socioeconomic History   Marital status: Married    Spouse name: Not on file   Number of children: Not on file   Years of education: Not on file   Highest education level: Not on file  Occupational History   Occupation: Truck Hospital Doctor  Tobacco Use   Smoking status: Every Day    Current packs/day: 1.00    Average packs/day: 1 pack/day for 26.7 years (26.7 ttl pk-yrs)    Types: Cigarettes    Start date: 05/27/1996   Smokeless tobacco: Former    Types: Engineer, Drilling   Vaping status: Never  Used  Substance and Sexual Activity   Alcohol use: Yes    Alcohol/week: 55.0 standard drinks of alcohol    Types: 55 Cans of beer per week   Drug use: No   Sexual activity: Yes  Other Topics Concern   Not on file  Social History Narrative   Not on file   Social Drivers of Health   Financial Resource Strain: Not on file  Food Insecurity: Not on file  Transportation Needs: Not on file  Physical Activity: Not on file  Stress: Not on file  Social Connections: Unknown (06/21/2021)   Received from [facility], [facility]   Social Network    Social Network: Not on file  Intimate Partner Violence: Unknown (05/11/2021)   Received from [facility], [facility]   HITS    Physically Hurt: Not on file    Insult or Talk Down To: Not on file    Threaten Physical Harm: Not on file    Scream or Curse: Not on file    Physical Exam: Vital signs in last 24 hours: @BP  (!) 159/76   Pulse 76   Temp 98 F (36.7 C)   Resp (!) 99   Ht 5' 10 (1.778 m)   Wt 210 lb (95.3 kg)   BMI 30.13 kg/m  GEN: NAD EYE: Sclerae anicteric ENT: MMM CV: Non-tachycardic Pulm: CTA b/l GI: Soft, NT/ND NEURO:  Alert & Oriented x 3   Sandor Flatter, DO [facility]   02/14/2023 2:13 PM

## 2023-02-14 NOTE — Progress Notes (Signed)
 Pt's states no medical or surgical changes since previsit or office visit.

## 2023-02-14 NOTE — Patient Instructions (Signed)
   Handouts on polyps & hemorrhoids given to you today.   Await pathology results on polyps removed   Continue previous diet & medications  After pathology results back ,Dr San will discuss referral to have resection of the cecal polyp that was not removed     YOU HAD AN ENDOSCOPIC PROCEDURE TODAY AT THE [facility]:   Refer to the procedure report that was given to you for any specific questions about what was found during the examination.  If the procedure report does not answer your questions, please call your gastroenterologist to clarify.  If you requested that your care partner not be given the details of your procedure findings, then the procedure report has been included in a sealed envelope for you to review at your convenience later.  YOU SHOULD EXPECT: Some feelings of bloating in the abdomen. Passage of more gas than usual.  Walking can help get rid of the air that was put into your GI tract during the procedure and reduce the bloating. If you had a lower endoscopy (such as a colonoscopy or flexible sigmoidoscopy) you may notice spotting of blood in your stool or on the toilet paper. If you underwent a bowel prep for your procedure, you may not have a normal bowel movement for a few days.  Please Note:  You might notice some irritation and congestion in your nose or some drainage.  This is from the oxygen used during your procedure.  There is no need for concern and it should clear up in a day or so.  SYMPTOMS TO REPORT IMMEDIATELY:  Following lower endoscopy (colonoscopy or flexible sigmoidoscopy):  Excessive amounts of blood in the stool  Significant tenderness or worsening of abdominal pains  Swelling of the abdomen that is new, acute  Fever of 100F or higher   For urgent or emergent issues, a gastroenterologist can be reached at any hour by calling [phone]. Do not use MyChart messaging for urgent concerns.    DIET:  We do recommend a small  meal at first, but then you may proceed to your regular diet.  Drink plenty of fluids but you should avoid alcoholic beverages for 24 hours.  ACTIVITY:  You should plan to take it easy for the rest of today and you should NOT DRIVE or use heavy machinery until tomorrow (because of the sedation medicines used during the test).    FOLLOW UP: Our staff will call the number listed on your records the next business day following your procedure.  We will call around 7:15- 8:00 am to check on you and address any questions or concerns that you may have regarding the information given to you following your procedure. If we do not reach you, we will leave a message.     If any biopsies were taken you will be contacted by phone or by letter within the next 1-3 weeks.  Please call us  at [phone] if you have not heard about the biopsies in 3 weeks.    SIGNATURES/CONFIDENTIALITY: You and/or your care partner have signed paperwork which will be entered into your electronic medical record.  These signatures attest to the fact that that the information above on your After Visit Summary has been reviewed and is understood.  Full responsibility of the confidentiality of this discharge information lies with you and/or your care-partner.

## 2023-02-14 NOTE — Op Note (Signed)
 [facility] Patient Name: Harold Bullock Procedure Date: 02/14/2023 2:30 PM MRN: [id number] Endoscopist: Sandor Flatter , MD, [id number] Age: 58 Referring MD:  Date of Birth: 03-16-1965 Gender: Male Account #: [id number] Procedure:                Colonoscopy Indications:              Screening for colorectal malignant neoplasm. This                            is the patient's first colonoscopy.                           Recetn + Cologuard study during routine screening.                            Otherwise, no overt GI bleeding. No FHx of colon                            cancer. Medicines:                Monitored Anesthesia Care Procedure:                Pre-Anesthesia Assessment:                           - Prior to the procedure, a History and Physical                            was performed, and patient medications and                            allergies were reviewed. The patient's tolerance of                            previous anesthesia was also reviewed. The risks                            and benefits of the procedure and the sedation                            options and risks were discussed with the patient.                            All questions were answered, and informed consent                            was obtained. Prior Anticoagulants: The patient has                            taken no anticoagulant or antiplatelet agents. ASA                            Grade Assessment: II - A patient with mild systemic  disease. After reviewing the risks and benefits,                            the patient was deemed in satisfactory condition to                            undergo the procedure.                           After obtaining informed consent, the colonoscope                            was passed under direct vision. Throughout the                            procedure, the patient's blood pressure, pulse, and                             oxygen saturations were monitored continuously. The                            CF [id number] #[id number] was introduced through the anus                            and advanced to the the terminal ileum. The                            colonoscopy was performed without difficulty. The                            patient tolerated the procedure well. The quality                            of the bowel preparation was good. The terminal                            ileum, ileocecal valve, appendiceal orifice, and                            rectum were photographed. Scope In: 2:44:56 PM Scope Out: 3:20:57 PM Scope Withdrawal Time: 0 hours 27 minutes 18 seconds  Total Procedure Duration: 0 hours 36 minutes 1 second  Findings:                 The perianal and digital rectal examinations were                            normal.                           A 25 mm polyp was found in the cecum. The polyp was                            flat. Area was successfully injected with 7 mL  saline for lesion assessment. This injection                            appeared to lift the lesion adequately. Estimated                            blood loss was minimal.                           Six sessile polyps were found in the sigmoid colon                            and descending colon. The polyps were 3 to 6 mm in                            size. These polyps were removed with a cold snare.                            Resection and retrieval were complete. Estimated                            blood loss was minimal.                           Multiple sessile polyps were found in the rectum                            and recto-sigmoid colon. The polyps were 4 to 6 mm                            in size. 7 of these polyps were removed with a cold                            snare. Resection and retrieval were complete.                            Estimated blood loss was minimal.                            Non-bleeding internal hemorrhoids were found during                            retroflexion. The hemorrhoids were small and Grade                            I (internal hemorrhoids that do not prolapse).                           The terminal ileum appeared normal.                           Retroflexion in the right colon was performed. Complications:            No immediate complications. Estimated Blood  Loss:     Estimated blood loss was minimal. Impression:               - One 25 mm polyp in the cecum. Injected.                           - Six 3 to 6 mm polyps in the sigmoid colon and in                            the descending colon, removed with a cold snare.                            Resected and retrieved.                           - Multiple 4 to 6 mm polyps in the rectum and at                            the recto-sigmoid colon, removed with a cold snare.                            Resected and retrieved.                           - Non-bleeding internal hemorrhoids.                           - The examined portion of the ileum was normal. Recommendation:           - Patient has a contact number available for                            emergencies. The signs and symptoms of potential                            delayed complications were discussed with the                            patient. Return to normal activities tomorrow.                            Written discharge instructions were provided to the                            patient.                           - Resume previous diet.                           - Continue present medications.                           - Await pathology results.                           -  Will discuss referral to either advanced GI for                            endoscopic resection of the large, flat cecal polyp                            vs referral to Colo-rectal Surgery for surgical                            resection  with the patietn following pathology                            results and recovery. Sandor Flatter, MD 02/14/2023 3:32:54 PM

## 2023-02-14 NOTE — Progress Notes (Signed)
 Sedate, gd SR, tolerated procedure well, VSS, report to RN

## 2023-02-14 NOTE — Progress Notes (Signed)
 Called to room to assist during endoscopic procedure.  Patient ID and intended procedure confirmed with present staff. Received instructions for my participation in the procedure from the performing physician.

## 2023-02-15 ENCOUNTER — Telehealth: Payer: Self-pay | Admitting: *Deleted

## 2023-02-15 NOTE — Telephone Encounter (Signed)
  Follow up Call-     02/14/2023    2:02 PM  Call back number  Post procedure Call Back phone  # [phone]  Permission to leave phone message Yes     Patient questions:  Do you have a fever, pain , or abdominal swelling? No. Pain Score  0 *  Have you tolerated food without any problems? Yes.    Have you been able to return to your normal activities? Yes.    Do you have any questions about your discharge instructions: Diet   No. Medications  No. Follow up visit  No.  Do you have questions or concerns about your Care? No.  Actions: * If pain score is 4 or above: No action needed, pain <4.

## 2023-02-20 LAB — SURGICAL PATHOLOGY

## 2023-06-13 ENCOUNTER — Encounter: Admitting: Nurse Practitioner

## 2023-06-16 ENCOUNTER — Encounter: Payer: Self-pay | Admitting: Nurse Practitioner

## 2023-06-16 ENCOUNTER — Ambulatory Visit (INDEPENDENT_AMBULATORY_CARE_PROVIDER_SITE_OTHER): Admitting: Nurse Practitioner

## 2023-06-16 ENCOUNTER — Ambulatory Visit: Payer: Self-pay | Admitting: *Deleted

## 2023-06-16 VITALS — BP 129/66 | HR 79 | Temp 98.3°F | Ht 70.0 in | Wt 216.0 lb

## 2023-06-16 DIAGNOSIS — E66811 Obesity, class 1: Secondary | ICD-10-CM

## 2023-06-16 DIAGNOSIS — E119 Type 2 diabetes mellitus without complications: Secondary | ICD-10-CM

## 2023-06-16 DIAGNOSIS — Z6830 Body mass index (BMI) 30.0-30.9, adult: Secondary | ICD-10-CM

## 2023-06-16 DIAGNOSIS — G4733 Obstructive sleep apnea (adult) (pediatric): Secondary | ICD-10-CM

## 2023-06-16 DIAGNOSIS — E785 Hyperlipidemia, unspecified: Secondary | ICD-10-CM

## 2023-06-16 DIAGNOSIS — R931 Abnormal findings on diagnostic imaging of heart and coronary circulation: Secondary | ICD-10-CM | POA: Diagnosis not present

## 2023-06-16 DIAGNOSIS — I10 Essential (primary) hypertension: Secondary | ICD-10-CM | POA: Diagnosis not present

## 2023-06-16 DIAGNOSIS — Q2112 Patent foramen ovale: Secondary | ICD-10-CM

## 2023-06-16 DIAGNOSIS — I2510 Atherosclerotic heart disease of native coronary artery without angina pectoris: Secondary | ICD-10-CM

## 2023-06-16 NOTE — Telephone Encounter (Signed)
 Called and spoke with patient. Made him an appt for this afternoon. Patient verbalized understanding

## 2023-06-16 NOTE — Progress Notes (Signed)
 Subjective:    Patient ID: Harold Bullock, male    DOB: November 09, 1965, 58 y.o.   MRN: [id number]   Chief Complaint: medical management of chronic issues     HPI:  Harold Bullock is a 58 y.o. who identifies as a male who was assigned male at birth.   Social history: Lives with: wife   Comes in today for follow up of the following chronic medical issues:  1. Primary hypertension No c/o chest pain, sob or headache. Does not check blood pressure at home. BP Readings from Last 3 Encounters:  02/14/23 133/82  02/13/23 (!) 147/75  01/30/23 (!) 142/80     2. Hyperlipidemia with target LDL less than 70 Hs been watching diet and stays very active. Is on crestor  daily Lab Results  Component Value Date   CHOL 235 (H) 01/30/2023   HDL 55 01/30/2023   LDLCALC 137 (H) 01/30/2023   TRIG 241 (H) 01/30/2023   CHOLHDL 4.3 01/30/2023   The 10-year ASCVD risk score (Arnett DK, et al., 2019) is: 39.4%   3. Diet-controlled diabetes mellitus (HCC) His blood sugaras have been good when he checks them. Does not check every day Lab Results  Component Value Date   HGBA1C 5.7 (H) 01/30/2023     4. Screening for prostate cancer No voiding issues Lab Results  Component Value Date   PSA1 0.5 07/25/2022   PSA1 0.4 04/23/2021   PSA1 0.5 02/13/2017      5. Agatston CAC score 200-399 6. Coronary artery disease, non-occlusive 7. PFO (patent foramen ovale) Last saw cardiology on 07/19/21. Only change made to plan of care was increase in crestor  to 40mg  daily  8. Pulmonary nodule, right We are watching pulmonary nodule. Repeat was due in May but was not scheduled  9. OSA (obstructive sleep apnea) Wears cpap nightly  10. Obesity (BMI 30.0-34.9) Weight is up 6 lbs  Wt Readings from Last 3 Encounters:  06/16/23 216 lb (98 kg)  02/14/23 210 lb (95.3 kg)  02/13/23 210 lb (95.3 kg)   BMI Readings from Last 3 Encounters:  06/16/23 30.99 kg/m  02/14/23 30.13 kg/m  02/13/23 30.13  kg/m        New complaints: None today  Allergies  Allergen Reactions   Poison Oak Extract Rash   Outpatient Encounter Medications as of 06/16/2023  Medication Sig   aspirin  EC 81 MG tablet Take 1 tablet (81 mg total) by mouth daily. Swallow whole.   lisinopril  (ZESTRIL ) 20 MG tablet Take 1 tablet (20 mg total) by mouth daily. (Patient not taking: Reported on 02/14/2023)   naproxen  (NAPROSYN ) 500 MG tablet Take 500 mg by mouth.   Omega-3 Fatty Acids (FISH OIL PO) Take by mouth.   OVER THE COUNTER MEDICATION Total Beets   VITAMIN D PO Take by mouth.   Facility-Administered Encounter Medications as of 06/16/2023  Medication   0.9 %  sodium chloride  infusion    Past Surgical History:  Procedure Laterality Date   BACK SURGERY  02/08/2003   COLONOSCOPY  02/14/2023   [facility] at [facility]    Family History  Problem Relation Age of Onset   Alzheimer's disease Mother    Prostate cancer Maternal Uncle    Cancer Maternal Uncle    Cancer Maternal Grandfather    Bone cancer Maternal Grandfather    Cancer Paternal Grandmother    Colon cancer Neg Hx    Esophageal cancer Neg Hx    Rectal cancer Neg Hx  Stomach cancer Neg Hx    Colon polyps Neg Hx       Controlled substance contract: n/a     Review of Systems  Constitutional:  Negative for diaphoresis.  Eyes:  Negative for pain.  Respiratory:  Negative for shortness of breath.   Cardiovascular:  Negative for chest pain, palpitations and leg swelling.  Gastrointestinal:  Negative for abdominal pain.  Endocrine: Negative for polydipsia.  Skin:  Negative for rash.  Neurological:  Negative for dizziness, weakness and headaches.  Hematological:  Does not bruise/bleed easily.  All other systems reviewed and are negative.      Objective:   Physical Exam Vitals and nursing note reviewed.  Constitutional:      Appearance: Normal appearance. He is well-developed.  HENT:     Head: Normocephalic.     Nose: Nose normal.      Mouth/Throat:     Mouth: Mucous membranes are moist.     Pharynx: Oropharynx is clear.  Eyes:     Pupils: Pupils are equal, round, and reactive to light.  Neck:     Thyroid: No thyroid mass or thyromegaly.     Vascular: No carotid bruit or JVD.     Trachea: Phonation normal.  Cardiovascular:     Rate and Rhythm: Normal rate and regular rhythm.  Pulmonary:     Effort: Pulmonary effort is normal. No respiratory distress.     Breath sounds: Normal breath sounds.  Abdominal:     General: Bowel sounds are normal.     Palpations: Abdomen is soft.     Tenderness: There is no abdominal tenderness.  Musculoskeletal:        General: Normal range of motion.     Cervical back: Normal range of motion and neck supple.  Lymphadenopathy:     Cervical: No cervical adenopathy.  Skin:    General: Skin is warm and dry.  Neurological:     Mental Status: He is alert and oriented to person, place, and time.  Psychiatric:        Behavior: Behavior normal.        Thought Content: Thought content normal.        Judgment: Judgment normal.     BP 129/66   Pulse 79   Temp 98.3 F (36.8 C) (Temporal)   Ht 5\' 10"  (1.778 m)   Wt 216 lb (98 kg)   SpO2 96%   BMI 30.99 kg/m      HGBA1c 5.4%      Assessment & Plan:  Harold Bullock comes in today with chief complaint of medical management of chronic issues    Diagnosis and orders addressed:  1. Primary hypertension Low sodium diet - CBC with Differential/Platelet - CMP14+EGFR - lisinopril  (ZESTRIL ) 20 MG tablet; Take 1 tablet (20 mg total) by mouth daily.  Dispense: 90 tablet; Refill: 1  2. Hyperlipidemia with target LDL less than 70 Low fat diet - Lipid panel - rosuvastatin  (CRESTOR ) 20 MG tablet; Take 1 tablet (20 mg total) by mouth daily.  Dispense: 90 tablet; Refill: 1  3. Diet-controlled diabetes mellitus (HCC) Continue to watch diet - Bayer DCA Hb A1c Waived  4. Screening for prostate cancer Labs pending - PSA, total  and free  5. Agatston CAC score 200-399 6. Coronary artery disease, non-occlusive 7. PFO (patent foramen ovale) Keep follow up with cardiology  8. Pulmonary nodule, right Will find out why chest CT has no t been schedyuled yet  9. OSA (obstructive sleep  apnea) Continue  to wear cpap at night  10. Obesity (BMI 30.0-34.9) Continue to watch diet and exercise   Labs pending Health Maintenance reviewed Diet and exercise encouraged  Follow up plan: 6 months   [name] Gaylyn Keas, FNP

## 2023-06-16 NOTE — Telephone Encounter (Signed)
  Chief Complaint: elevated BP at times, requesting PCP to check  Symptoms: episodes of elevated BP with mild sx headaches , blurred vision but none now. Patient unable to check BP now and is out of town  Frequency: yesterday  Pertinent Negatives: Patient denies sx now  Disposition: [] ED /[] Urgent Care (no appt availability in office) / [x] Appointment(In office/virtual)/ []  [facility]/ [] Home Care/ [] Refused Recommended Disposition /[] [facility]/ []  Follow-up with PCP Additional Notes:   Patient requesting PCP to see him and check BP today after he returns in town from work. Patient agreeable to schedule appt 06/19/23. Patient requesting PCP to see him today and wants to come to office for latest appt available. Please advise.       Copied from CRM [id number]. Topic: Clinical - Red Word Triage >> Jun 16, 2023  8:50 AM Shelby Dessert H wrote: [facility] that prompted transfer to Nurse Triage: Patient is high problems with getting his blood pressure down, patient said he quiet smoking about a month ago and even slowed down on drinking beers, patient blood pressure 137/72 patient said its been like that for about a month, patients said he has no headaches, he has some funny feeling which he thought was from stop smoking but it does not hurt. No chest pain Reason for Disposition  [1] Taking BP medications AND [2] feels is having side effects (e.g., impotence, cough, dizzy upon standing)  Answer Assessment - Initial Assessment Questions 1. BLOOD PRESSURE: "What is the blood pressure?" "Did you take at least two measurements 5 minutes apart?"     BP 137/72 unable to recheck BP now due to out of town 2. ONSET: "When did you take your blood pressure?"      This Morning  3. HOW: "How did you take your blood pressure?" (e.g., automatic home BP monitor, visiting nurse)     Monitor at home  4. HISTORY: "Do you have a history of high blood pressure?"     Taking medication 5. MEDICINES:  "Are you taking any medicines for blood pressure?" "Have you missed any doses recently?"     Lisinopril  but recently restarted medication  6. OTHER SYMPTOMS: "Do you have any symptoms?" (e.g., blurred vision, chest pain, difficulty breathing, headache, weakness)     No sx now has reported mild headaches and BP 190/62 over weekend 7. PREGNANCY: "Is there any chance you are pregnant?" "When was your last menstrual period?"     na  Protocols used: Blood Pressure - High-A-AH

## 2023-06-16 NOTE — Patient Instructions (Signed)

## 2023-06-19 ENCOUNTER — Ambulatory Visit: Admitting: Nurse Practitioner

## 2023-06-22 ENCOUNTER — Other Ambulatory Visit: Payer: Self-pay | Admitting: Nurse Practitioner

## 2023-06-22 DIAGNOSIS — I10 Essential (primary) hypertension: Secondary | ICD-10-CM

## 2023-06-22 MED ORDER — LISINOPRIL 20 MG PO TABS: 20.0000 mg | ORAL_TABLET | Freq: Every day | ORAL | 1 refills | Status: DC

## 2023-06-22 NOTE — Telephone Encounter (Signed)
 Copied from CRM [id number]. Topic: Clinical - Medication Refill >> Jun 22, 2023 11:57 AM Blair Bumpers wrote: Medication: lisinopril  (ZESTRIL ) 20 MG tablet  Has the patient contacted their pharmacy? No (Agent: If no, request that the patient contact the pharmacy for the refill. If patient does not wish to contact the pharmacy document the reason why and proceed with request.) (Agent: If yes, when and what did the pharmacy advise?)  This is the patient's preferred pharmacy:  [id number] - [place], [place] - [address] [employer] [place] [address] Phone: [phone] Fax: [phone]  Is this the correct pharmacy for this prescription? Yes If no, delete pharmacy and type the correct one.   Has the prescription been filled recently? No  Is the patient out of the medication? Yes  Has the patient been seen for an appointment in the last year OR does the patient have an upcoming appointment? Yes  Can we respond through MyChart? Yes  Agent: Please be advised that Rx refills may take up to 3 business days. We ask that you follow-up with your pharmacy.

## 2023-06-28 LAB — OPHTHALMOLOGY REPORT-SCANNED

## 2023-08-04 ENCOUNTER — Ambulatory Visit: Payer: BC Managed Care – PPO | Admitting: Nurse Practitioner

## 2023-08-22 ENCOUNTER — Ambulatory Visit: Payer: Self-pay

## 2023-08-22 ENCOUNTER — Encounter: Payer: Self-pay | Admitting: Nurse Practitioner

## 2023-08-22 ENCOUNTER — Ambulatory Visit (INDEPENDENT_AMBULATORY_CARE_PROVIDER_SITE_OTHER): Admitting: Nurse Practitioner

## 2023-08-22 ENCOUNTER — Ambulatory Visit: Payer: Self-pay | Admitting: Nurse Practitioner

## 2023-08-22 VITALS — BP 156/77 | HR 80 | Temp 98.5°F | Ht 70.0 in | Wt 223.0 lb

## 2023-08-22 DIAGNOSIS — I10 Essential (primary) hypertension: Secondary | ICD-10-CM | POA: Diagnosis not present

## 2023-08-22 DIAGNOSIS — R0609 Other forms of dyspnea: Secondary | ICD-10-CM

## 2023-08-22 MED ORDER — LISINOPRIL 40 MG PO TABS: 40.0000 mg | ORAL_TABLET | Freq: Every day | ORAL | 1 refills | Status: DC

## 2023-08-22 NOTE — Telephone Encounter (Signed)
 FYI Only or Action Required?: FYI only for provider.  Patient was last seen in primary care on 06/16/2023 by Gladis Mustard, FNP.  Called Nurse Triage reporting Hypertension.  Symptoms began today.  Interventions attempted: Nothing.  Symptoms are: stable.  Triage Disposition: See PCP When Office is Open (Within 3 Days)  Patient/caregiver understands and will follow disposition?: Yes     Copied from CRM [id number]. Topic: Clinical - Red Word Triage >> Aug 22, 2023  2:06 PM Paige D wrote: Red Word that prompted transfer to Nurse Triage: Pt is stating he has been having high blood pressure. 180/160. Reason for Disposition  Systolic BP >= 160 OR Diastolic >= 100  Answer Assessment - Initial Assessment Questions 1. BLOOD PRESSURE: What is your blood pressure? Did you take at least two measurements 5 minutes apart?     180/160? at [facility] appt -- upon further investigation, per chart [facility] documented 180/76  2. ONSET: When did you take your blood pressure?     Today  3. HOW: How did you take your blood pressure? (e.g., automatic home BP monitor, visiting nurse)     During procedure at [facility] 4. HISTORY: Do you have a history of high blood pressure?     Yes Sunday 161/62 post hiking 5. MEDICINES: Are you taking any medicines for blood pressure? Have you missed any doses recently?     lisinopril  (ZESTRIL ) 20 MG - last took 5 days ago -- pt stopped taking d/t [facility] Procedure 6. OTHER SYMPTOMS: Do you have any symptoms? (e.g., blurred vision, chest pain, difficulty breathing, headache, weakness)     Denies Endorses having SOB, weakness and has to stop and take a break when hiking 7. PREGNANCY: Is there any chance you are pregnant? When was your last menstrual period?     N/a  Protocols used: Blood Pressure - High-A-AH

## 2023-08-22 NOTE — Patient Instructions (Signed)
 Chest Pain (Angina): What to Know Angina is pain or discomfort in the chest. It can also be felt in the neck, arm, jaw, or back. Angina is caused by not having enough blood flow to the heart wall. Angina may be a warning that you're at risk for having a heart attack. What are the causes? Angina is most often caused by build-up of plaque in your arteries that makes it hard for blood to flow. Plaque narrows and blocks the arteries of the heart. Plaque is made of fats and cholesterol. Angina is also caused by: Sudden spasms of the muscles in the arteries of the heart. Small artery disease. Heart valve problems. A tear in an artery of your heart. Weakness of the heart muscle. What increases the risk? Main risks Having high cholesterol. High blood pressure. Having diabetes. Family history of heart disease. Not exercising or moving enough. Having had radiation treatment to the left side of your chest. Other risks Using tobacco products. Being very overweight. Eating foods that have a lot of unhealthy fats. Feeling stressed or having depression. Using drugs, such as cocaine. What are the signs or symptoms? Symptoms in all people Chest pain, which may: Feel like a crushing or squeezing in the chest. Feel like a tightness, pressure, or heaviness in the chest. Last for more than a few minutes at a time. Stop and come back. Pain in the neck, arm, jaw, or back. Heartburn or upset stomach for no reason. Being short of breath. Feeling like you may throw up. Sudden cold sweats. Other symptoms in females Tiredness or weakness. Worry and anxiety. Dizziness or fainting. How is this diagnosed?  Your symptoms and medical history. Blood tests. Electrocardiogram (ECG) to measure the electrical activity of your heart. Stress test to look for signs of a blocked artery. CT angiogram to examine your heart and the blood flow to it. Coronary angiogram to check for a blocked artery. How is this  treated? Medicines to: Prevent blood clots. Relax blood vessels and improve blood flow to the heart. Lower blood pressure. Reduce cholesterol. You may have a procedure called angioplasty to widen a narrowed or blocked artery. A small mesh tube called a stent may be put in the artery to keep it open. Surgery may be needed to allow blood to go around a blocked artery. Follow these instructions at home: Medicines Take your medicines only as told. Do not take these medicines unless your provider says that you can: NSAIDs, such as ibuprofen and naproxen. Supplements that contain vitamin A, vitamin E, or both. Hormone therapy that contains estrogen with or without progestin. Eating and drinking  Eat a healthy diet that includes: Lots of fresh fruits and vegetables. Whole grains. Low-fat protein. Low-fat dairy products. Follow instructions about what you may eat and drink. Activity Exercise as told. Talk with your provider about doing a program called cardiac rehab to help make your heart strong. When you feel tired, take a break. Plan breaks if you know you're going to feel tired. Lifestyle Do not smoke, vape, or use nicotine or tobacco. If your provider says you can drink alcohol: Limit how much you have to: 0-1 drink a day if you're male and not pregnant. 0-2 drinks a day if you're male. Know how much alcohol is in your drink. In the [place] one drink is one 12 oz bottle of beer (355 mL), one 5 oz glass of wine (148 mL), or one 1 oz glass of hard liquor (44 mL). General instructions  Stay at a healthy weight. If told to lose weight, work with your provider to lose weight safely. Keep your vaccines up to date. Get a flu shot every year. Learn to manage stress. If you need help, ask your provider. Talk with your provider if you feel depressed. Work with your provider to manage any other health problems that you have. These may include diabetes or high blood pressure. Keep all  follow-up visits. Your provider will want to check on your condition. Get help right away if: You have pain in your chest, neck, arm, jaw, or back, and the pain: Happens more often. Lasts more than a few minutes. Goes away and comes back. Does not get better after you take medicine under your tongue. You're dizzy or light-headed all of a sudden. You faint. You have any combination of these problems: Cold sweats. Heartburn or upset stomach. Trouble breathing. Feeling like you may throw up, or you throw up. Feeling very tired or weak. Feeling worried or nervous. These symptoms may be an emergency. Call 911 right away. Do not wait to see if the symptoms will go away. Do not drive yourself to the hospital. This information is not intended to replace advice given to you by your health care provider. Make sure you discuss any questions you have with your health care provider. Document Revised: 12/06/2022 Document Reviewed: 06/19/2022 Elsevier Patient Education  2024 [employer].

## 2023-08-22 NOTE — Progress Notes (Signed)
 Subjective:    Patient ID: Harold Bullock, male    DOB: 01-Sep-1965, 58 y.o.   MRN: [id number]   Chief Complaint: Hypertension   Hypertension Pertinent negatives include no chest pain, headaches, palpitations or shortness of breath.    Patient comes in c/o elevated blood pressure. Had colonoscopy today and blood pressure was elevated. It concerned him so he wanted to be seen. He has been checking blood pressure at home and has been Bullock 160 systolic. Has been hiking and has noticed that he is getting weak quickly. He has not taken meds today. He has not seen cardiology in couple of years. 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  Eyes:  Negative for pain.  Respiratory:  Negative for shortness of breath.   Cardiovascular:  Negative for chest pain, palpitations and leg swelling.  Gastrointestinal:  Negative for abdominal pain.  Endocrine: Negative for polydipsia.  Skin:  Negative for rash.  Neurological:  Negative for dizziness, weakness and headaches.  Hematological:  Does not bruise/bleed easily.  All other systems reviewed and are negative.      Objective:   Physical Exam Constitutional:      Appearance: Normal appearance.  Cardiovascular:     Rate and Rhythm: Normal rate and regular rhythm.     Heart sounds: Normal heart sounds.  Pulmonary:     Effort: Pulmonary effort is normal.     Breath sounds: Normal breath sounds.  Skin:     General: Skin is warm.  Neurological:     General: No focal deficit present.     Mental Status: He is alert and oriented to person, place, and time.  Psychiatric:        Mood and Affect: Mood normal.        Behavior: Behavior normal.    BP (!) 156/77   Pulse 80   Temp 98.5 F (36.9 C) (Temporal)   Ht 5' 10 (1.778 m)   Wt 223 lb (101.2 kg)   SpO2 95%   BMI 32.00 kg/m   EJG- NSR-Preliminary reading by Ronal Lunger, FNP  [facility]       Assessment & Plan:  Harold Bullock in today with chief complaint of Hypertension   1. DOE (dyspnea on exertion) (Primary) No hiking by yourself Drink lots of water Rest frequently - Ambulatory referral to Cardiology - CBC with Differential/Platelet - CMP14+EGFR - Thyroid  Panel With TSH  2. Primary hypertension Increase lisinopril  to 40mg  daily Keep diary of blood pressure at home.  - lisinopril  (ZESTRIL ) 40 MG tablet; Take 1 tablet (40 mg total) by mouth daily.  Dispense: 90 tablet; Refill: 1    The above assessment and management plan was discussed with the patient. The patient verbalized understanding of and has agreed to the management plan. Patient is aware to call the clinic if symptoms persist or worsen. Patient is aware when to return to the clinic for a follow-up visit. Patient educated on when  it is appropriate to go to the emergency department.   [name] Gladis, FNP

## 2023-08-22 NOTE — Telephone Encounter (Signed)
 E2C2 scheduled appointmetn. Patient came into office today, patient seen by PCP

## 2023-08-23 ENCOUNTER — Ambulatory Visit

## 2023-08-23 LAB — THYROID PANEL WITH TSH
Free Thyroxine Index: 1.2 (ref 1.2–4.9)
T3 Uptake Ratio: 26 % (ref 24–39)
T4, Total: 4.7 ug/dL (ref 4.5–12.0)
TSH: 2.11 u[IU]/mL (ref 0.450–4.500)

## 2023-08-23 LAB — CBC WITH DIFFERENTIAL/PLATELET
Basophils Absolute: 0 x10E3/uL (ref 0.0–0.2)
Basos: 1 %
EOS (ABSOLUTE): 0 x10E3/uL (ref 0.0–0.4)
Eos: 0 %
Hematocrit: 39.4 % (ref 37.5–51.0)
Hemoglobin: 13.4 g/dL (ref 13.0–17.7)
Immature Grans (Abs): 0 x10E3/uL (ref 0.0–0.1)
Immature Granulocytes: 0 %
Lymphocytes Absolute: 0.7 x10E3/uL (ref 0.7–3.1)
Lymphs: 8 %
MCH: 34.5 pg — ABNORMAL HIGH (ref 26.6–33.0)
MCHC: 34 g/dL (ref 31.5–35.7)
MCV: 102 fL — ABNORMAL HIGH (ref 79–97)
Monocytes Absolute: 0.4 x10E3/uL (ref 0.1–0.9)
Monocytes: 6 %
Neutrophils Absolute: 6.8 x10E3/uL (ref 1.4–7.0)
Neutrophils: 84 %
Platelets: 196 x10E3/uL (ref 150–450)
RBC: 3.88 x10E6/uL — ABNORMAL LOW (ref 4.14–5.80)
RDW: 12.2 % (ref 11.6–15.4)
WBC: 8 x10E3/uL (ref 3.4–10.8)

## 2023-08-23 LAB — CMP14+EGFR
ALT: 28 IU/L (ref 0–44)
AST: 23 IU/L (ref 0–40)
Albumin: 4.3 g/dL (ref 3.8–4.9)
Alkaline Phosphatase: 83 IU/L (ref 44–121)
BUN/Creatinine Ratio: 13 (ref 9–20)
BUN: 13 mg/dL (ref 6–24)
Bilirubin Total: 0.3 mg/dL (ref 0.0–1.2)
CO2: 21 mmol/L (ref 20–29)
Calcium: 9.4 mg/dL (ref 8.7–10.2)
Chloride: 101 mmol/L (ref 96–106)
Creatinine, Ser: 0.99 mg/dL (ref 0.76–1.27)
Globulin, Total: 2.4 g/dL (ref 1.5–4.5)
Glucose: 198 mg/dL — ABNORMAL HIGH (ref 70–99)
Potassium: 4.1 mmol/L (ref 3.5–5.2)
Sodium: 139 mmol/L (ref 134–144)
Total Protein: 6.7 g/dL (ref 6.0–8.5)
eGFR: 88 mL/min/1.73 (ref >59)

## 2023-09-21 ENCOUNTER — Telehealth: Payer: Self-pay

## 2023-09-21 NOTE — Telephone Encounter (Signed)
 Copied from CRM #[id number]. Topic: Clinical - Medication Prior Auth >> Sep 21, 2023 10:40 AM [name] wrote: Reason for CRM: Pt would like to see if he can back to being prescribed Ozempic or Wegovy for weight loss management. Please call him back at [phone] to discuss.

## 2023-09-22 ENCOUNTER — Encounter: Payer: Self-pay | Admitting: Nurse Practitioner

## 2023-10-04 ENCOUNTER — Ambulatory Visit: Attending: Cardiology | Admitting: Cardiology

## 2023-10-04 ENCOUNTER — Encounter: Payer: Self-pay | Admitting: Cardiology

## 2023-10-04 VITALS — BP 140/70 | HR 70 | Ht 69.0 in | Wt 233.0 lb

## 2023-10-04 DIAGNOSIS — R931 Abnormal findings on diagnostic imaging of heart and coronary circulation: Secondary | ICD-10-CM

## 2023-10-04 DIAGNOSIS — Z87891 Personal history of nicotine dependence: Secondary | ICD-10-CM

## 2023-10-04 DIAGNOSIS — R0609 Other forms of dyspnea: Secondary | ICD-10-CM | POA: Insufficient documentation

## 2023-10-04 DIAGNOSIS — E785 Hyperlipidemia, unspecified: Secondary | ICD-10-CM

## 2023-10-04 DIAGNOSIS — E119 Type 2 diabetes mellitus without complications: Secondary | ICD-10-CM

## 2023-10-04 DIAGNOSIS — G720 Drug-induced myopathy: Secondary | ICD-10-CM

## 2023-10-04 DIAGNOSIS — T466X5D Adverse effect of antihyperlipidemic and antiarteriosclerotic drugs, subsequent encounter: Secondary | ICD-10-CM

## 2023-10-04 DIAGNOSIS — G4733 Obstructive sleep apnea (adult) (pediatric): Secondary | ICD-10-CM

## 2023-10-04 DIAGNOSIS — Q2112 Patent foramen ovale: Secondary | ICD-10-CM

## 2023-10-04 DIAGNOSIS — I2510 Atherosclerotic heart disease of native coronary artery without angina pectoris: Secondary | ICD-10-CM | POA: Diagnosis not present

## 2023-10-04 DIAGNOSIS — E66811 Obesity, class 1: Secondary | ICD-10-CM

## 2023-10-04 DIAGNOSIS — R739 Hyperglycemia, unspecified: Secondary | ICD-10-CM

## 2023-10-04 DIAGNOSIS — I10 Essential (primary) hypertension: Secondary | ICD-10-CM

## 2023-10-04 LAB — LIPID PANEL

## 2023-10-04 MED ORDER — HYDROCHLOROTHIAZIDE 25 MG PO TABS: 25.0000 mg | ORAL_TABLET | Freq: Every day | ORAL | 3 refills | Status: DC

## 2023-10-04 NOTE — Patient Instructions (Addendum)
 Medication Instructions:  Hydrochlorothiazide  25 mg  one tablet day.  *If you need a refill on your cardiac medications before your next appointment, please call your pharmacy*   Lab Work: LIPID CMP [id number]   If you have labs (blood work) drawn today and your tests are completely normal, you will receive your results only by: MyChart Message (if you have MyChart) OR A paper copy in the mail If you have any lab test that is abnormal or we need to change your treatment, we will call you to review the results.   Testing/Procedures:  Not  needed  Follow-Up: At [facility], you and your health needs are our priority.  As part of our continuing mission to provide you with exceptional heart care, we have created designated Provider Care Teams.  These Care Teams include your primary Cardiologist (physician) and Advanced Practice Providers (APPs -  Physician Assistants and Nurse Practitioners) who all work together to provide you with the care you need, when you need it.     Your next appointment:   3 to 4 month(s)  The format for your next appointment:   In Person  Provider:   Alm Clay, MD  You have been referred to  CVR -- Statin intolerance, check blood pressure  Other Instructions

## 2023-10-04 NOTE — Progress Notes (Signed)
 Cardiology Office Note:  .   Date:  10/08/2023  ID:  Harold Bullock, DOB [birth year], MRN [id number] PCP: Harold Mustard, FNP  [facility] Providers Cardiologist:  Alm Clay, MD     Chief Complaint  Patient presents with   Follow-up    2-year follow-up with mild to moderate CAD   Coronary Artery Disease    Nonobstructive CAD by Coronary CTA    Patient Profile: .     Harold Bullock is a moderately obese 58 y.o. male former smoker with a PMH reviewed below who presents here for essentially a 2-year follow-up at the request of Harold, Harold Bullock, *.  PMH :Nonocclusive CAD by Coronary CTA (Coronary Calcium  Score 243-minimal CAD on cath), HTN, HLD, hyperglycemia, OSA and possible PFO noted on Coronary CTA     Harold Bullock was last seen on July 28, 2021 to discuss results of his Coronary CTA.  We also ordered an echo with bubble study to evaluate the PFO noted on Coronary CTA.  Discussed potentially titrating up rosuvastatin  to 40 mg.  He is no longer taking anything.  Subjective  Discussed the use of AI scribe software for clinical note transcription with the patient, who gave verbal consent to proceed.  History of Present Illness Harold Bullock is a 58 year old male with coronary artery disease and hypertension who presents for follow-up regarding shortness of breath and weight gain after smoking cessation.  He quit smoking on April 8th, resulting in a weight gain of 24 pounds and decreased energy levels. His chest pain resolved two days after quitting smoking. He has difficulty with weight management and increased appetite, stating 'I just want something to eat all the damn time.'  He experiences shortness of breath during physical activities such as hiking, which he attributes to weight gain and possibly the summer humidity. He needs to stop to catch his breath more frequently than before. No recent chest pain, pressure, or tightness since quitting  smoking. No stroke-like symptoms such as weakness, numbness, slurred speech, or drooling. No palpitations. Reports possible leg swelling but not significant. No orthopnea or paroxysmal nocturnal dyspnea unless CPAP comes off.  He previously underwent a coronary CT scan which showed mild plaque and a coronary calcium  score of approximately 240. He has a history of a patent foramen ovale.  He is currently taking lisinopril  40 mg for hypertension, which has been Bullock high at around 148/90. He previously took rosuvastatin  but discontinued it due to muscle pain. He has not been taking Zetia or Ozempic  recently.  He uses a CPAP machine regularly and considers it his 'most prized possession.' He saw Ronal Rollene Harold about a month ago for a colonoscopy and discussed shortness of breath with her.    Objective    Studies Reviewed: SABRA   EKG Interpretation Date/Time:  Wednesday October 04 2023 11:18:46 EDT Ventricular Rate:  70 PR Interval:  156 QRS Duration:  90 QT Interval:  406 QTC Calculation: 438 R Axis:   54  Text Interpretation: Normal sinus rhythm Normal ECG No previous ECGs available Confirmed by Clay Alm ([id number]) on 10/04/2023 11:36:22 AM    Results LABS Triglycerides: 241 (01/2023) LDL: 137 (01/2023) Total cholesterol: 35 (01/2023) A1c: 5.7 (08/2023)  RADIOLOGY Coronary CT scan: Mild plaque, coronary calcium  score approximately 240, less than 30% stenosis  DIAGNOSTIC Echocardiogram with Bubble: Normal LV size and function EF 65 to 70%.  No RWMA.  Mild LVH.  G1 DD.  Bubble study negative  but evidence of shunting noted by color-flow.  Small patent foramen ovale with predominantly left-to-right shunting across IAS. Cor CTA: Coronary Calcium  Score 243 (88th percentile), minimal-mild nonobstructive CAD-CAD RADS 2.  Mid LAD bridging segment noted.   PFO with left-to-right shunting suspected.  Aortic root atherosclerosis (11/2020)  Risk Assessment/Calculations:            Physical Exam:   VS:  BP (!) 140/70   Pulse 70   Ht 5' 9 (1.753 m)   Wt 233 lb (105.7 kg)   SpO2 97%   BMI 34.41 kg/m    Noticed significant weight gain after smoking cessation in April 2025. Wt Readings from Last 3 Encounters:  10/04/23 233 lb (105.7 kg)  08/22/23 223 lb (101.2 kg)  06/16/23 216 lb (98 kg)    GEN: Well nourished, well developed in no acute distress; moderately obese. NECK: No JVD; No carotid bruits CARDIAC: Normal S1, S2; RRR, no murmurs, rubs, gallops RESPIRATORY:  Clear to auscultation without rales, wheezing or rhonchi ; nonlabored, good air movement. ABDOMEN: Soft, non-tender, non-distended EXTREMITIES:  No edema; No deformity     ASSESSMENT AND PLAN: .    Problem List Items Addressed This Visit       Cardiology Problems   Agatston CAC score 200-399 (Chronic)   Unfortunately, he did not take either of our initial discussion and is no longer taking any type of cholesterol medication => he stopped taking statin citing myalgias/myopathy, but did not have any ADL started at PCPs office.SABRA   He is taking aspirin  81 mg daily along with lisinopril  40 mg daily. -- BP still adequately controlled, will add HCTZ 25 mg daily - Reassess lipid panel with low threshold to initiate medications => will initially try Nexletol/Zetia but low threshold to consider injection options.  (PCSK9 inhibitors versus inclisiran ).      Relevant Medications   hydrochlorothiazide  (HYDRODIURIL ) 25 MG tablet   Other Relevant Orders   Lipid panel (Completed)   Comprehensive metabolic panel with GFR (Completed)   Hemoglobin A1c (Completed)   AMB Referral to [facility] Pharm-D   Coronary artery disease, non-occlusive (Chronic)   Coronary CT only showed mild plaque, calcium  score 240, no significant stenosis. Smoking cessation reduced risk, but high cholesterol and blood pressure remain concerns. - Focus on controlling blood pressure and cholesterol to prevent progression of  atherosclerosis. - Consider stress test if shortness of breath persists after optimizing risk factors. - For now continue aspirin  81 mg daily along with lisinopril  40 mg daily and Zetia 25 mg daily. -Reassess lipids with plans to initiate therapy.      Relevant Medications   hydrochlorothiazide  (HYDRODIURIL ) 25 MG tablet   Other Relevant Orders   EKG 12-Lead (Completed)   Lipid panel (Completed)   Comprehensive metabolic panel with GFR (Completed)   Hemoglobin A1c (Completed)   AMB Referral to [facility] Pharm-D   Hyperlipidemia with target LDL less than 70 (Chronic)   Cholesterol elevated with LDL at 137 mg/dL, triglycerides at 758 mg/dL. Intolerance to rosuvastatin  due to muscle pain. Goal LDL <70 mg/dL. Non-statin options needed. - Refer to cholesterol clinic for evaluation and management with non-statin options. - Check blood work to assess current lipid levels.      Relevant Medications   hydrochlorothiazide  (HYDRODIURIL ) 25 MG tablet   Other Relevant Orders   EKG 12-Lead (Completed)   Lipid panel (Completed)   Comprehensive metabolic panel with GFR (Completed)   Hemoglobin A1c (Completed)   AMB Referral to [facility]  Pharm-D   PFO (patent foramen ovale) (Chronic)   Small PFO with only left-to-right shunting noted on echo.  Not likely because issues with no evidence of right-to-left shunting. Remains asymptomatic. No intervention unless symptoms develop. - Monitor for any new symptoms such as stroke-like symptoms or significant shortness of breath. - Continue aspirin  81 mg daily      Relevant Medications   hydrochlorothiazide  (HYDRODIURIL ) 25 MG tablet   Other Relevant Orders   Lipid panel (Completed)   Comprehensive metabolic panel with GFR (Completed)   Hemoglobin A1c (Completed)   AMB Referral to [facility] Pharm-D   Primary hypertension - Primary (Chronic)   Blood pressure elevated at 148 mmHg on lisinopril  40 mg. Optimizing control essential to reduce cardiovascular  risk. - Add hydrochlorothiazide  (HCTZ) 25 mg to lisinopril  r 40 mg egimen. - Monitor blood pressure response to medication adjustment.      Relevant Medications   hydrochlorothiazide  (HYDRODIURIL ) 25 MG tablet   Other Relevant Orders   EKG 12-Lead (Completed)   Lipid panel (Completed)   Comprehensive metabolic panel with GFR (Completed)   Hemoglobin A1c (Completed)   AMB Referral to [facility]     Other   Diet-controlled diabetes mellitus (HCC)   A1c at 5.7% indicates pre-diabetes. - Check A1c levels to monitor glucose control.      DOE (dyspnea on exertion)   Relevant Orders   Lipid panel (Completed)   Comprehensive metabolic panel with GFR (Completed)   Hemoglobin A1c (Completed)   AMB Referral to [facility] Pharm-D   Former smoker (Chronic)   Quit smoking on May 16, 2023. Discussed benefits on cardiovascular health and post-cessation challenges. - Continue to support smoking cessation efforts.      Obesity (BMI 30.0-34.9) (Chronic)   Weight gain post-smoking cessation contributes to shortness of breath. Discussed cardiovascular impact and weight management importance. Interested in weight management medications. - Discuss potential use of weight management medications like Wegovy, pending further evaluation by pharmacy team.      Relevant Orders   Lipid panel (Completed)   Comprehensive metabolic panel with GFR (Completed)   Hemoglobin A1c (Completed)   AMB Referral to [facility]   OSA (obstructive sleep apnea) (Chronic)   Continue CPAP      Relevant Orders   Lipid panel (Completed)   Comprehensive metabolic panel with GFR (Completed)   Hemoglobin A1c (Completed)   AMB Referral to [facility]   Statin myopathy (Chronic)   He stopped taking rosuvastatin  20 mg daily before increasing to 40 mg because of myalgias/myopathy. Lipids have LDL well over the 120s.  May not get to goal LDL less than 70 with Nexletol/Zetia. - Low threshold to consider  evaluation for PCSK9 inhibitors versus inclisiran.      Relevant Orders   Lipid panel (Completed)   Comprehensive metabolic panel with GFR (Completed)   Hemoglobin A1c (Completed)   AMB Referral to [facility] Pharm-D   Other Visit Diagnoses       Hyperglycemia, unspecified       Relevant Orders   Hemoglobin A1c (Completed)   AMB Referral to [facility] Pharm-D             Follow-Up: Return in about 3 months (around 01/04/2024) for [employer], BP follow-up with CV[facility], Cholesterol F/u with [facility].     Signed, Alm MICAEL Clay, MD, MS Alm Clay, M.D., M.S. Interventional Chartered certified accountant  Pager # [phone]

## 2023-10-05 LAB — COMPREHENSIVE METABOLIC PANEL WITH GFR
ALT: 31 IU/L (ref 0–44)
AST: 23 IU/L (ref 0–40)
Albumin: 4.7 g/dL (ref 3.8–4.9)
Alkaline Phosphatase: 90 IU/L (ref 44–121)
BUN/Creatinine Ratio: 15 (ref 9–20)
BUN: 14 mg/dL (ref 6–24)
Bilirubin Total: 0.4 mg/dL (ref 0.0–1.2)
CO2: 23 mmol/L (ref 20–29)
Calcium: 9.7 mg/dL (ref 8.7–10.2)
Chloride: 101 mmol/L (ref 96–106)
Creatinine, Ser: 0.95 mg/dL (ref 0.76–1.27)
Globulin, Total: 2.6 g/dL (ref 1.5–4.5)
Glucose: 124 mg/dL — AB (ref 70–99)
Potassium: 4.7 mmol/L (ref 3.5–5.2)
Sodium: 139 mmol/L (ref 134–144)
Total Protein: 7.3 g/dL (ref 6.0–8.5)
eGFR: 92 mL/min/1.73 (ref >59)

## 2023-10-05 LAB — HEMOGLOBIN A1C
Est. average glucose Bld gHb Est-mCnc: 154 mg/dL
Hgb A1c MFr Bld: 7 % — ABNORMAL HIGH (ref 4.8–5.6)

## 2023-10-05 LAB — LIPID PANEL
Cholesterol, Total: 268 mg/dL — AB (ref 100–199)
HDL: 42 mg/dL (ref >39)
LDL CALC COMMENT:: 6.4 ratio — AB (ref 0.0–5.0)
LDL Chol Calc (NIH): 144 mg/dL — AB (ref 0–99)
Triglycerides: 442 mg/dL — AB (ref 0–149)
VLDL Cholesterol Cal: 82 mg/dL — AB (ref 5–40)

## 2023-10-07 ENCOUNTER — Ambulatory Visit: Payer: Self-pay | Admitting: Cardiology

## 2023-10-07 ENCOUNTER — Encounter: Payer: Self-pay | Admitting: Nurse Practitioner

## 2023-10-08 ENCOUNTER — Encounter: Payer: Self-pay | Admitting: Cardiology

## 2023-10-08 DIAGNOSIS — Z87891 Personal history of nicotine dependence: Secondary | ICD-10-CM | POA: Insufficient documentation

## 2023-10-08 NOTE — Assessment & Plan Note (Signed)
 Blood pressure elevated at 148 mmHg on lisinopril  40 mg. Optimizing control essential to reduce cardiovascular risk. - Add hydrochlorothiazide  (HCTZ) 25 mg to lisinopril  r 40 mg egimen. - Monitor blood pressure response to medication adjustment.

## 2023-10-08 NOTE — Assessment & Plan Note (Signed)
 A1c at 5.7% indicates pre-diabetes. - Check A1c levels to monitor glucose control.

## 2023-10-08 NOTE — Assessment & Plan Note (Signed)
 Coronary CT only showed mild plaque, calcium  score 240, no significant stenosis. Smoking cessation reduced risk, but high cholesterol and blood pressure remain concerns. - Focus on controlling blood pressure and cholesterol to prevent progression of atherosclerosis. - Consider stress test if shortness of breath persists after optimizing risk factors. - For now continue aspirin  81 mg daily along with lisinopril  40 mg daily and Zetia 25 mg daily. -Reassess lipids with plans to initiate therapy.

## 2023-10-08 NOTE — Assessment & Plan Note (Signed)
 Small PFO with only left-to-right shunting noted on echo.  Not likely because issues with no evidence of right-to-left shunting. Remains asymptomatic. No intervention unless symptoms develop. - Monitor for any new symptoms such as stroke-like symptoms or significant shortness of breath. - Continue aspirin  81 mg daily

## 2023-10-08 NOTE — Assessment & Plan Note (Signed)
 Cholesterol elevated with LDL at 137 mg/dL, triglycerides at 758 mg/dL. Intolerance to rosuvastatin  due to muscle pain. Goal LDL <70 mg/dL. Non-statin options needed. - Refer to cholesterol clinic for evaluation and management with non-statin options. - Check blood work to assess current lipid levels.

## 2023-10-08 NOTE — Assessment & Plan Note (Signed)
 Continue CPAP.

## 2023-10-08 NOTE — Assessment & Plan Note (Signed)
 Unfortunately, he did not take either of our initial discussion and is no longer taking any type of cholesterol medication => he stopped taking statin citing myalgias/myopathy, but did not have any ADL started at PCPs office.SABRA   He is taking aspirin  81 mg daily along with lisinopril  40 mg daily. -- BP still adequately controlled, will add HCTZ 25 mg daily - Reassess lipid panel with low threshold to initiate medications => will initially try Nexletol/Zetia but low threshold to consider injection options.  (PCSK9 inhibitors versus inclisiran ).

## 2023-10-08 NOTE — Assessment & Plan Note (Signed)
 Weight gain post-smoking cessation contributes to shortness of breath. Discussed cardiovascular impact and weight management importance. Interested in weight management medications. - Discuss potential use of weight management medications like Wegovy, pending further evaluation by pharmacy team.

## 2023-10-08 NOTE — Progress Notes (Signed)
 Lipid panel looks worse than 8 months ago: Total cholesterol up to 268 from 225.  Triglycerides significantly elevated at 442 from 241 LDL is up from 137 to current level 144.  We definitely to address management with what options there are.  With intolerance of rosuvastatin  20 mg, will probably require at a minimum Nexlizet but more likely inclisiran or PCSK9 inhibitors.  Inclisiran may be a good option to ensure compliance/adherence.  He has been referred to [facility].  Alm Clay, MD

## 2023-10-08 NOTE — Assessment & Plan Note (Signed)
 He stopped taking rosuvastatin  20 mg daily before increasing to 40 mg because of myalgias/myopathy. Lipids have LDL well over the 120s.  May not get to goal LDL less than 70 with Nexletol/Zetia. - Low threshold to consider evaluation for PCSK9 inhibitors versus inclisiran.

## 2023-10-08 NOTE — Assessment & Plan Note (Signed)
 Quit smoking on May 16, 2023. Discussed benefits on cardiovascular health and post-cessation challenges. - Continue to support smoking cessation efforts.

## 2023-10-13 ENCOUNTER — Encounter: Payer: Self-pay | Admitting: Nurse Practitioner

## 2023-10-13 ENCOUNTER — Ambulatory Visit (INDEPENDENT_AMBULATORY_CARE_PROVIDER_SITE_OTHER): Admitting: Nurse Practitioner

## 2023-10-13 VITALS — BP 121/68 | HR 76 | Temp 97.7°F | Ht 69.0 in | Wt 229.0 lb

## 2023-10-13 DIAGNOSIS — Z7985 Long-term (current) use of injectable non-insulin antidiabetic drugs: Secondary | ICD-10-CM | POA: Diagnosis not present

## 2023-10-13 DIAGNOSIS — E119 Type 2 diabetes mellitus without complications: Secondary | ICD-10-CM

## 2023-10-13 MED ORDER — LANCETS MISC. MISC: 1.0000 | Freq: Three times a day (TID) | 0 refills | Status: AC

## 2023-10-13 MED ORDER — OZEMPIC (0.25 OR 0.5 MG/DOSE) 2 MG/3ML ~~LOC~~ SOPN
0.2500 mg | PEN_INJECTOR | SUBCUTANEOUS | 2 refills | Status: AC
Start: 2023-10-13 — End: ?

## 2023-10-13 MED ORDER — BLOOD GLUCOSE TEST VI STRP: 1.0000 | ORAL_STRIP | Freq: Three times a day (TID) | 0 refills | Status: AC

## 2023-10-13 MED ORDER — BLOOD GLUCOSE MONITORING SUPPL DEVI: 1.0000 | Freq: Three times a day (TID) | 0 refills | Status: AC

## 2023-10-13 MED ORDER — LANCET DEVICE MISC: 1.0000 | Freq: Three times a day (TID) | 0 refills | Status: AC

## 2023-10-13 NOTE — Patient Instructions (Signed)

## 2023-10-13 NOTE — Progress Notes (Signed)
 Subjective:    Patient ID: Harold Bullock, male    DOB: 08-Aug-1965, 58 y.o.   MRN: [id number]   Chief Complaint: New diabetic   HPI  Patient saw cardiology last week. They did hba1c and was elevated 7.0%. blood sugar has always been up slightly but was never fasting.   Patient Active Problem List   Diagnosis Date Noted   Former smoker 10/08/2023   Statin myopathy 10/04/2023   DOE (dyspnea on exertion) 10/04/2023   Agatston CAC score 200-399 01/13/2021   Diet-controlled diabetes mellitus (HCC) 01/13/2021   PFO (patent foramen ovale) 01/13/2021   Pulmonary nodule, right 12/11/2020   Coronary artery disease, non-occlusive 12/11/2020   Hyperlipidemia with target LDL less than 70 11/18/2020   Obesity (BMI 30.0-34.9) 06/24/2019   Primary hypertension 06/24/2019   Rash of face 06/24/2019   Chronic pain of left knee 02/21/2018   OSA (obstructive sleep apnea) 05/21/2015       Review of Systems  Constitutional:  Negative for diaphoresis.  Eyes:  Negative for pain.  Respiratory:  Negative for shortness of breath.   Cardiovascular:  Negative for chest pain, palpitations and leg swelling.  Gastrointestinal:  Negative for abdominal pain.  Endocrine: Negative for polydipsia.  Skin:  Negative for rash.  Neurological:  Negative for dizziness, weakness and headaches.  Hematological:  Does not bruise/bleed easily.  All other systems reviewed and are negative.      Objective:   Physical Exam Constitutional:      Appearance: Normal appearance.  Cardiovascular:     Rate and Rhythm: Normal rate and regular rhythm.     Heart sounds: Normal heart sounds.  Pulmonary:     Breath sounds: Normal breath sounds.  Skin:    General: Skin is warm.  Neurological:     General: No focal deficit present.     Mental Status: He is alert and oriented to person, place, and time.  Psychiatric:        Mood and Affect: Mood normal.        Behavior: Behavior normal.    BP 121/68   Pulse 76    Temp 97.7 F (36.5 C) (Temporal)   Ht 5' 9 (1.753 m)   Wt 229 lb (103.9 kg)   SpO2 96%   BMI 33.82 kg/m         Assessment & Plan:   Harold Bullock in today with chief complaint of New diabetic   1. Diabetes mellitus without complication (HCC) (Primary) Carb counting Keep diary of blood sugars - Blood Glucose Monitoring Suppl DEVI; 1 each by Does not apply route in the morning, at noon, and at bedtime. May substitute to any manufacturer covered by patient's insurance.  Dispense: 1 each; Refill: 0 - Glucose Blood (BLOOD GLUCOSE TEST STRIPS) STRP; 1 each by In Vitro route in the morning, at noon, and at bedtime. May substitute to any manufacturer covered by patient's insurance.  Dispense: 100 strip; Refill: 0 - Lancet Device MISC; 1 each by Does not apply route in the morning, at noon, and at bedtime. May substitute to any manufacturer covered by patient's insurance.  Dispense: 1 each; Refill: 0 - Lancets Misc. MISC; 1 each by Does not apply route in the morning, at noon, and at bedtime. May substitute to any manufacturer covered by patient's insurance.  Dispense: 100 each; Refill: 0 - Semaglutide ,0.25 or 0.5MG /DOS, (OZEMPIC , 0.25 OR 0.5 MG/DOSE,) 2 MG/3ML SOPN; Inject 0.25 mg into the skin once a week.  Dispense:  2 mL; Refill: 2    The above assessment and management plan was discussed with the patient. The patient verbalized understanding of and has agreed to the management plan. Patient is aware to call the clinic if symptoms persist or worsen. Patient is aware when to return to the clinic for a follow-up visit. Patient educated on when it is appropriate to go to the emergency department.   [name] Gladis, FNP

## 2023-11-06 ENCOUNTER — Other Ambulatory Visit (HOSPITAL_COMMUNITY): Payer: Self-pay

## 2023-11-06 ENCOUNTER — Ambulatory Visit: Attending: Cardiology | Admitting: Pharmacist

## 2023-11-06 ENCOUNTER — Telehealth: Payer: Self-pay | Admitting: Pharmacy Technician

## 2023-11-06 ENCOUNTER — Encounter: Payer: Self-pay | Admitting: Pharmacist

## 2023-11-06 ENCOUNTER — Telehealth: Payer: Self-pay | Admitting: Pharmacist

## 2023-11-06 DIAGNOSIS — E785 Hyperlipidemia, unspecified: Secondary | ICD-10-CM | POA: Diagnosis not present

## 2023-11-06 MED ORDER — ROSUVASTATIN CALCIUM 5 MG PO TABS: 5.0000 mg | ORAL_TABLET | Freq: Every day | ORAL | 3 refills | Status: DC

## 2023-11-06 MED ORDER — ROSUVASTATIN CALCIUM 5 MG PO TABS
5.0000 mg | ORAL_TABLET | Freq: Every day | ORAL | 3 refills | Status: DC
Start: 2023-11-06 — End: 2023-11-06
  Filled 2023-11-06: qty 90, 90d supply, fill #0

## 2023-11-06 NOTE — Telephone Encounter (Signed)
 Both require pa. Ins wont do generic vascepa  Pharmacy Patient Advocate Encounter   Received notification from Pt Calls Messages that prior authorization for repatha is required/requested.   Insurance verification completed.   The patient is insured through [facility] .   Per test claim: PA required; PA started via CoverMyMeds. KEY [id number] . Please see clinical question(s) below that I am not finding the answer to in their chart and advise.   Did the patient score a 7 or higher on the Statin-Associated Muscle Symptom Clinical Index (SAMS-CI) and failed statin rechallenge? ACTION REQUIRED: If Yes, attach chart notes or medical record documentation confirming the SAMS-CI score and failed rechallenge with statin therapy.   Has the patient received this statin dose in combination with ezetimibe for at least 3 months?    Pharmacy Patient Advocate Encounter   Received notification from Pt Calls Messages that prior authorization for vascepa is required/requested.   Insurance verification completed.   The patient is insured through [facility] .   Per test claim: PA required; PA submitted to above mentioned insurance via Latent Key/confirmation #/EOC [id number] Status is pending   Pharmacy Patient Advocate Encounter  Has to be brand  Received notification from [facility] that Prior Authorization for vascepa has been APPROVED from 11/06/23 to 11/05/24. Ran test claim, Copay is $0.00. This test claim was processed through [facility]- copay amounts may vary at other pharmacies due to pharmacy/plan contracts, or as the patient moves through the different stages of their insurance plan.   PA #/Case ID/Reference #: [id number]

## 2023-11-06 NOTE — Patient Instructions (Signed)
 Your Results:             Your most recent labs Goal  Total Cholesterol 268 < 200  Triglycerides 442 < 150  HDL (happy/good cholesterol) 42 > 40  LDL (lousy/bad cholesterol 144 < 55   Medication changes: start taking rosuvastatin  5 mg daily or every other day  We will start the process to get PCSK9i ( Repatha or Praluent) and Vascepa covered by your insurance.  Once the prior authorization is complete, we will call you to let you know and confirm pharmacy information.    Praluent is a cholesterol medication that improved your body's ability to get rid of bad cholesterol known as LDL. It can lower your LDL up to 60%. It is an injection that is given under the skin every 2 weeks. The most common side effects of Praluent include runny nose, symptoms of the common cold, rarely flu or flu-like symptoms, back/muscle pain in about 3-4% of the patients, and redness, pain, or bruising at the injection site.    Repatha is a cholesterol medication that improved your body's ability to get rid of bad cholesterol known as LDL. It can lower your LDL up to 60%! It is an injection that is given under the skin every 2 weeks. The medication often requires a prior authorization from your insurance company.  The most common side effects of Repatha include runny nose, symptoms of the common cold, rarely flu or flu-like symptoms, back/muscle pain in about 3-4% of the patients, and redness, pain, or bruising at the injection site.   Lab orders: We want to repeat labs after 2-3 months.  We will send you a lab order to remind you once we get closer to that time.

## 2023-11-06 NOTE — Progress Notes (Signed)
 Patient ID: Harold Bullock                 DOB: 1965/11/04                    MRN: [id number]      HPI: Harold Bullock is a 58 y.o. male patient referred to lipid clinic by Dr.Harding . 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 Intolerance to rosuvastatin  due to muscle pain. Goal LDL <70 mg/dL. Non-statin options needed  Patient is unable to tolerate statin due to severe muscle pain he has tried 2 statins so far - rosuvastatin  and atorvastatin . Diet need improvement. We discussed how unhealthy food, and alcohol affects TG levels. Significance of exercise in lowering TG.  Reviewed options for lowering LDL cholesterol, including ezetimibe, PCSK-9 inhibitors, bempedoic acid and inclisiran.  Discussed mechanisms of action, dosing, side effects and potential decreases in LDL cholesterol.  Also reviewed cost information and potential options for patient assistance.  Past experience with statins ( both rosuvastatin   and atorvastatin )  Lower extremities muscle including thighs was started hurting within 4 weeks of starting the statin ane when he stopped taking them symptoms improved within 2 weeks. 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 for him to manage he was on good diet before so will try to get back on that   Exercise:  Hike 5.5 miles every Sunday   Family History:  Relation Problem Comments  Mother Metallurgist) Alzheimer's  disease     Father (Deceased)   Maternal Uncle Cancer   Prostate cancer     Maternal Grandfather Bone cancer   Cancer     Paternal Grandmother Cancer     Social History:  Alcohol: 5-6 beers  Smoking: quit April 2025  Labs:  Lipid Panel     Component Value Date/Time   CHOL 268 (H) 10/04/2023 1233   TRIG 442 (H) 10/04/2023 1233   HDL 42 10/04/2023 1233   CHOLHDL 6.4 (H) 10/04/2023 1233   LDLCALC 144 (H) 10/04/2023 1233   LABVLDL 82 (H) 10/04/2023 1233    Past Medical History:  Diagnosis Date   Coronary artery disease, non-occlusive 12/11/2020   Coronary calcium  score 243.  Minimal-mild nonobstructive CAD (CAD RADS 2.  Mid LAD bridging segment noted.  Minimal (<24%) proximal RCA and left main.  Minimal proximal LAD and small D1.  Mild(25-49%) proximal LCx.  Several OM branches without significant disease.   Essential hypertension    Hyperlipidemia due to dietary fat intake 09/08/2020   TC 207, TG 242, HDL 46, LDL 119.   Metabolic syndrome    Obesity, hypertension, hypertriglyceridemia.   Obese    OSA (obstructive sleep apnea) 05/21/2015   Sleep apnea     Current Outpatient Medications on File Prior to Visit  Medication Sig Dispense Refill   Blood Glucose Monitoring  Suppl DEVI 1 each by Does not apply route in the morning, at noon, and at bedtime. May substitute to any manufacturer covered by patient's insurance. 1 each 0   Glucose Blood (BLOOD GLUCOSE TEST STRIPS) STRP 1 each by In Vitro route in the morning, at noon, and at bedtime. May substitute to any manufacturer covered by patient's insurance. 100 strip 0   hydrochlorothiazide  (HYDRODIURIL ) 25 MG tablet Take 1 tablet (25 mg total) by mouth daily. 90 tablet 3   Lancet Device MISC 1 each by Does not apply route in the morning, at noon, and at bedtime. May substitute to any manufacturer covered by patient's insurance. 1 each 0   Lancets Misc. MISC 1 each by Does not apply route in the morning, at noon, and at bedtime.  May substitute to any manufacturer covered by patient's insurance. 100 each 0   lisinopril  (ZESTRIL ) 40 MG tablet Take 1 tablet (40 mg total) by mouth daily. 90 tablet 1   naproxen  (NAPROSYN ) 500 MG tablet Take 500 mg by mouth.     Polyethylene Glycol 400 (BLINK TEARS OP) Apply to eye.     Semaglutide ,0.25 or 0.5MG /DOS, (OZEMPIC , 0.25 OR 0.5 MG/DOSE,) 2 MG/3ML SOPN Inject 0.25 mg into the skin once a week. 2 mL 2   VITAMIN D PO Take by mouth.     Current Facility-Administered Medications on File Prior to Visit  Medication Dose Route Frequency Provider Last Rate Last Admin   0.9 %  sodium chloride  infusion  500 mL Intravenous Once Cirigliano, Vito V, DO        Allergies  Allergen Reactions   Poison Oak Extract Rash    Assessment/Plan:  1. Hyperlipidemia -  Problem  Hyperlipidemia With Target Ldl Less Than 70   Current Medications: none  Intolerances: rosuvastatin  20 mg daily and atorvastatin  40 mg daily  Risk Factors: CAD, CAC score 243, HTN  LDL goal: <55 mg/dl TG <849 mg/dl  Last lab: 91/7974 TC 731, TG 442, HDL 42, LDLc 144 off of the therapy  01/2023 TC 235, TG 241, HDL 55, LDLc 137 off of the therapy     Hyperlipidemia with target LDL less than 70 Assessment:  LDL goal: <55  mg/dl  TG <849 mg/dl last LDLc  855  mg/dl TG 557 mg/dl  (91/7974) Currently not on any statins  Intolerance to statins Lipitor 40 mg daily and Crestor  20 mg daily- myalgia  Discussed next potential options (PCSK-9 inhibitors, bempedoic acid and inclisiran); cost, dosing efficacy, side effects  Diet needs lots of improvement and alcohol intake is 5-6 beers per day  Dicussed significance of following healthy diet, moderate alcohol intake and regular exercise on overall health and TG   In agreement to start Vascepa to lower TG along with lifestyle changes  Will get patient to try lowest dose rosuvastatin  to get TG lowering benefit along with prevention benefit will optimize therapy by adding PCSK9i agent    PCP started Ozempic  for BG control will help reducing TG level along with lifestyle interventions and Vascepa   Plan: Start taking rosuvastatin  5 mg daily if not tolerated well reduce dose to 5 mg every other day  Will apply for PA for PCSK9i and Vascepa; will inform patient upon approval (prefers MyChart message) Encourage the patient to make small, consistent changes toward a healthier diet. Advise limiting beer intake to no more than 2 per day Lipid lab due in 2-3 months of therapy optimization     Thank you,  Harold  Bullock, Pharm.D Harold Bullock. [facility] [address], [place], [place] [address] Phone: [phone]; Fax: [phone]

## 2023-11-06 NOTE — Assessment & Plan Note (Signed)
 Assessment:  LDL goal: <55  mg/dl  TG <849 mg/dl last LDLc  855  mg/dl TG 557 mg/dl  (91/7974) Currently not on any statins  Intolerance to statins Lipitor 40 mg daily and Crestor  20 mg daily- myalgia  Discussed next potential options (PCSK-9 inhibitors, bempedoic acid and inclisiran); cost, dosing efficacy, side effects  Diet needs lots of improvement and alcohol intake is 5-6 beers per day  Dicussed significance of following healthy diet, moderate alcohol intake and regular exercise on overall health and TG   In agreement to start Vascepa to lower TG along with lifestyle changes  Will get patient to try lowest dose rosuvastatin  to get TG lowering benefit along with prevention benefit will optimize therapy by adding PCSK9i agent   PCP started Ozempic  for BG control will help reducing TG level along with lifestyle interventions and Vascepa   Plan: Start taking rosuvastatin  5 mg daily if not tolerated well reduce dose to 5 mg every other day  Will apply for PA for PCSK9i and Vascepa; will inform patient upon approval (prefers MyChart message) Encourage the patient to make small, consistent changes toward a healthier diet. Advise limiting beer intake to no more than 2 per day Lipid lab due in 2-3 months of therapy optimization

## 2023-11-07 ENCOUNTER — Other Ambulatory Visit (HOSPITAL_COMMUNITY): Payer: Self-pay

## 2023-11-08 NOTE — Telephone Encounter (Signed)
 PA for Vascepa and Repatha has been requested.

## 2023-11-08 NOTE — Telephone Encounter (Signed)
 Pharmacy Patient Advocate Encounter   Received notification from Pt Calls Messages-vaishali that prior authorization for repatha is required/requested.   Insurance verification completed.   The patient is insured through [facility].   Per test claim: PA required; PA submitted to above mentioned insurance via Latent Key/confirmation #/EOC [id number] Status is pending

## 2023-11-09 ENCOUNTER — Other Ambulatory Visit (HOSPITAL_COMMUNITY): Payer: Self-pay

## 2023-11-09 MED ORDER — REPATHA SURECLICK 140 MG/ML ~~LOC~~ SOAJ: 140.0000 mg | SUBCUTANEOUS | 3 refills | Status: AC

## 2023-11-09 MED ORDER — ICOSAPENT ETHYL 1 G PO CAPS: 2.0000 g | ORAL_CAPSULE | Freq: Two times a day (BID) | ORAL | 11 refills | Status: AC

## 2023-11-09 NOTE — Addendum Note (Signed)
 Addended by: Arrington Bencomo K on: 11/09/2023 04:48 PM   Modules accepted: Orders

## 2023-11-09 NOTE — Telephone Encounter (Signed)
 PA for Repatha has been approved. Vascepa and Repatha prescription sent to the preferred pharmacy

## 2023-11-09 NOTE — Telephone Encounter (Signed)
 Pharmacy Patient Advocate Encounter  Received notification from [facility] that Prior Authorization for repatha has been APPROVED from 11/08/23 to 11/07/24. Ran test claim, Copay is $35.00. This test claim was processed through [facility]- copay amounts may vary at other pharmacies due to pharmacy/plan contracts, or as the patient moves through the different stages of their insurance plan.   PA #/Case ID/Reference #: [id number]

## 2023-11-22 ENCOUNTER — Other Ambulatory Visit: Payer: Self-pay | Admitting: Nurse Practitioner

## 2023-11-22 DIAGNOSIS — E119 Type 2 diabetes mellitus without complications: Secondary | ICD-10-CM

## 2023-11-22 NOTE — Telephone Encounter (Unsigned)
 Copied from CRM [id number]. Topic: Clinical - Medication Refill >> Nov 22, 2023  4:37 PM Fredrica W wrote: Medication: Semaglutide ,0.25 or 0.5MG /DOS, (OZEMPIC , 0.25 OR 0.5 MG/DOSE,) 2 MG/3ML SOPN -  says he should be taking .5 / did 4 .25 and 2 .5  Has the patient contacted their pharmacy? Yes (Agent: If no, request that the patient contact the pharmacy for the refill. If patient does not wish to contact the pharmacy document the reason why and proceed with request.) (Agent: If yes, when and what did the pharmacy advise?)  This is the patient's preferred pharmacy:  [id number] [place] [place] - [address] [place] [address] Phone: [phone] Fax: [phone]  Is this the correct pharmacy for this prescription? No If no, delete pharmacy and type the correct one.   Has the prescription been filled recently? No  Is the patient out of the medication? Yes  Has the patient been seen for an appointment in the last year OR does the patient have an upcoming appointment? Yes 9/5/22025  Can we respond through MyChart? Yes  Agent: Please be advised that Rx refills may take up to 3 business days. We ask that you follow-up with your pharmacy.

## 2023-11-24 MED ORDER — OZEMPIC (0.25 OR 0.5 MG/DOSE) 2 MG/3ML ~~LOC~~ SOPN: 0.2500 mg | PEN_INJECTOR | SUBCUTANEOUS | 2 refills | Status: DC

## 2023-12-15 ENCOUNTER — Ambulatory Visit: Admitting: Nurse Practitioner

## 2024-01-09 ENCOUNTER — Encounter: Payer: Self-pay | Admitting: Nurse Practitioner

## 2024-01-09 ENCOUNTER — Ambulatory Visit: Payer: Self-pay | Admitting: Nurse Practitioner

## 2024-01-09 VITALS — BP 138/75 | HR 86 | Temp 98.3°F | Ht 69.0 in | Wt 233.0 lb

## 2024-01-09 DIAGNOSIS — R911 Solitary pulmonary nodule: Secondary | ICD-10-CM

## 2024-01-09 DIAGNOSIS — R931 Abnormal findings on diagnostic imaging of heart and coronary circulation: Secondary | ICD-10-CM

## 2024-01-09 DIAGNOSIS — E119 Type 2 diabetes mellitus without complications: Secondary | ICD-10-CM

## 2024-01-09 DIAGNOSIS — E66811 Obesity, class 1: Secondary | ICD-10-CM

## 2024-01-09 DIAGNOSIS — I2510 Atherosclerotic heart disease of native coronary artery without angina pectoris: Secondary | ICD-10-CM

## 2024-01-09 DIAGNOSIS — I10 Essential (primary) hypertension: Secondary | ICD-10-CM

## 2024-01-09 DIAGNOSIS — G4733 Obstructive sleep apnea (adult) (pediatric): Secondary | ICD-10-CM

## 2024-01-09 DIAGNOSIS — E1169 Type 2 diabetes mellitus with other specified complication: Secondary | ICD-10-CM

## 2024-01-09 LAB — CBC WITH DIFFERENTIAL/PLATELET
Basophils Absolute: 0 x10E3/uL (ref 0.0–0.2)
Basos: 1 %
EOS (ABSOLUTE): 0.1 x10E3/uL (ref 0.0–0.4)
Eos: 2 %
Hematocrit: 42.2 % (ref 37.5–51.0)
Hemoglobin: 14 g/dL (ref 13.0–17.7)
Immature Grans (Abs): 0.1 x10E3/uL (ref 0.0–0.1)
Immature Granulocytes: 1 %
Lymphocytes Absolute: 0.9 x10E3/uL (ref 0.7–3.1)
Lymphs: 16 %
MCH: 34.2 pg — ABNORMAL HIGH (ref 26.6–33.0)
MCHC: 33.2 g/dL (ref 31.5–35.7)
MCV: 103 fL — ABNORMAL HIGH (ref 79–97)
Monocytes Absolute: 0.5 x10E3/uL (ref 0.1–0.9)
Monocytes: 8 %
Neutrophils Absolute: 4.1 x10E3/uL (ref 1.4–7.0)
Neutrophils: 72 %
Platelets: 228 x10E3/uL (ref 150–450)
RBC: 4.09 x10E6/uL — ABNORMAL LOW (ref 4.14–5.80)
RDW: 13.4 % (ref 11.6–15.4)
WBC: 5.7 x10E3/uL (ref 3.4–10.8)

## 2024-01-09 LAB — CMP14+EGFR
ALT: 28 IU/L (ref 0–44)
AST: 23 IU/L (ref 0–40)
Albumin: 4.6 g/dL (ref 3.8–4.9)
Alkaline Phosphatase: 91 IU/L (ref 47–123)
BUN/Creatinine Ratio: 13 (ref 9–20)
BUN: 13 mg/dL (ref 6–24)
Bilirubin Total: 0.3 mg/dL (ref 0.0–1.2)
CO2: 24 mmol/L (ref 20–29)
Calcium: 10.3 mg/dL — ABNORMAL HIGH (ref 8.7–10.2)
Chloride: 99 mmol/L (ref 96–106)
Creatinine, Ser: 1.02 mg/dL (ref 0.76–1.27)
Globulin, Total: 2.9 g/dL (ref 1.5–4.5)
Glucose: 139 mg/dL — ABNORMAL HIGH (ref 70–99)
Potassium: 4.9 mmol/L (ref 3.5–5.2)
Sodium: 136 mmol/L (ref 134–144)
Total Protein: 7.5 g/dL (ref 6.0–8.5)
eGFR: 85 mL/min/1.73 (ref >59)

## 2024-01-09 LAB — LIPID PANEL
Chol/HDL Ratio: 2.5 ratio (ref 0.0–5.0)
Cholesterol, Total: 115 mg/dL (ref 100–199)
HDL: 46 mg/dL (ref >39)
LDL Chol Calc (NIH): 37 mg/dL (ref 0–99)
Triglycerides: 199 mg/dL — ABNORMAL HIGH (ref 0–149)
VLDL Cholesterol Cal: 32 mg/dL (ref 5–40)

## 2024-01-09 LAB — BAYER DCA HB A1C WAIVED: HB A1C (BAYER DCA - WAIVED): 6.4 % — ABNORMAL HIGH (ref 4.8–5.6)

## 2024-01-09 MED ORDER — HYDROCHLOROTHIAZIDE 25 MG PO TABS: 25.0000 mg | ORAL_TABLET | Freq: Every day | ORAL | 3 refills | Status: AC

## 2024-01-09 MED ORDER — OZEMPIC (0.25 OR 0.5 MG/DOSE) 2 MG/3ML ~~LOC~~ SOPN: 0.5000 mg | PEN_INJECTOR | SUBCUTANEOUS | 2 refills | Status: AC

## 2024-01-09 MED ORDER — ROSUVASTATIN CALCIUM 5 MG PO TABS: 5.0000 mg | ORAL_TABLET | Freq: Every day | ORAL | 3 refills | Status: AC

## 2024-01-09 MED ORDER — LISINOPRIL 40 MG PO TABS: 40.0000 mg | ORAL_TABLET | Freq: Every day | ORAL | 1 refills | Status: AC

## 2024-01-09 NOTE — Patient Instructions (Signed)

## 2024-01-09 NOTE — Progress Notes (Signed)
 Subjective:    Patient ID: Harold Bullock, male    DOB: 07-11-1965, 58 y.o.   MRN: [id number]   Chief Complaint: medical management of chronic issues     HPI:  Harold Bullock is a 58 y.o. who identifies as a male who was assigned male at birth.   Social history: Lives with: wife   Comes in today for follow up of the following chronic medical issues:  1. Primary hypertension No c/o chest pain, sob or headache. Does not check blood pressure at home. BP Readings from Last 3 Encounters:  10/13/23 121/68  10/04/23 (!) 140/70  08/22/23 (!) 156/77     2. Hyperlipidemia associated with type 2 diabetes Hs been watching diet and stays very active. Is now on repatha  and vescepa. 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 Does not check every day Lab Results  Component Value Date   HGBA1C 7.0 (H) 10/04/2023     4. Agatston CAC score 200-399 5. Coronary artery disease, non-occlusive 6. PFO (patent foramen ovale) Last saw cardiology on 11/06/23. He had been having dyspnea on exertion. Only change made to plan of care was increase in crestor  to 40mg  daily. They were trying  to get repatha  or vescepa covered by insurance due to risk factors.   7. Pulmonary nodule, right We are watching pulmonary nodule. Repeat Chest CT was done 08/04/22 and was unchanged and states no further follow up was needed.   8. OSA (obstructive sleep apnea) Wears cpap nightly  9.  Obesity (BMI 30.0-34.9) Weight is up 4lbs  Wt Readings from Last 3 Encounters:  01/09/24 233 lb (105.7 kg)  10/13/23 229 lb (103.9 kg)  10/04/23 233 lb (105.7 kg)   BMI Readings from Last 3 Encounters:  01/09/24  34.41 kg/m  10/13/23 33.82 kg/m  10/04/23 34.41 kg/m          New complaints: None today  Allergies  Allergen Reactions   Poison Oak Extract Rash   Outpatient Encounter Medications as of 01/09/2024  Medication Sig   Blood Glucose Monitoring Suppl DEVI 1 each by Does not apply route in the morning, at noon, and at bedtime. May substitute to any manufacturer covered by patient's insurance.   Evolocumab  (REPATHA  SURECLICK) 140 MG/ML SOAJ Inject 140 mg into the skin every 14 (fourteen) days.   hydrochlorothiazide  (HYDRODIURIL ) 25 MG tablet Take 1 tablet (25 mg total) by mouth daily.   icosapent  Ethyl (VASCEPA ) 1 g capsule Take 2 capsules (2 g total) by mouth 2 (two) times daily.   lisinopril  (ZESTRIL ) 40 MG tablet Take 1 tablet (40 mg total) by mouth daily.   naproxen  (NAPROSYN ) 500 MG tablet Take 500 mg by mouth.   Polyethylene Glycol 400 (BLINK TEARS OP) Apply to eye.   rosuvastatin  (CRESTOR ) 5 MG tablet Take 1 tablet (5 mg total) by mouth daily.   Semaglutide ,0.25 or 0.5MG /DOS, (OZEMPIC , 0.25 OR 0.5 MG/DOSE,) 2 MG/3ML SOPN Inject 0.25 mg into the skin once a week.   VITAMIN D PO Take by mouth.   Facility-Administered Encounter Medications as of 01/09/2024  Medication   0.9 %  sodium chloride   infusion    Past Surgical History:  Procedure Laterality Date   BACK SURGERY  02/08/2003   COLONOSCOPY  02/14/2023   [facility]or Flatter at [facility]    Family History  Problem Relation Age of Onset   Alzheimer's disease Mother    Prostate cancer Maternal Uncle    Cancer Maternal Uncle    Cancer Maternal Grandfather    Bone cancer Maternal Grandfather    Cancer Paternal Grandmother    Colon cancer Neg Hx    Esophageal cancer Neg Hx    Rectal cancer Neg Hx    Stomach cancer Neg Hx    Colon polyps Neg Hx       Controlled substance contract: n/a     Review of Systems  Constitutional:  Negative for diaphoresis.  Eyes:  Negative for pain.  Respiratory:  Negative for shortness  of breath.   Cardiovascular:  Negative for chest pain, palpitations and leg swelling.  Gastrointestinal:  Negative for abdominal pain.  Endocrine: Negative for polydipsia.  Skin:  Negative for rash.  Neurological:  Negative for dizziness, weakness and headaches.  Hematological:  Does not bruise/bleed easily.  All other systems reviewed and are negative.      Objective:   Physical Exam Vitals and nursing note reviewed.  Constitutional:      Appearance: Normal appearance. He is well-developed.  HENT:     Head: Normocephalic.     Nose: Nose normal.     Mouth/Throat:     Mouth: Mucous membranes are moist.     Pharynx: Oropharynx is clear.  Eyes:     Pupils: Pupils are equal, round, and reactive to light.  Neck:     Thyroid : No thyroid  mass or thyromegaly.     Vascular: No carotid bruit or JVD.     Trachea: Phonation normal.  Cardiovascular:     Rate and Rhythm: Normal rate and regular rhythm.  Pulmonary:     Effort: Pulmonary effort is normal. No respiratory distress.     Breath sounds: Normal breath sounds.  Abdominal:     General: Bowel sounds are normal.     Palpations: Abdomen is soft.     Tenderness: There is no abdominal tenderness.  Musculoskeletal:        General: Normal range of motion.     Cervical back: Normal range of motion and neck supple.  Lymphadenopathy:     Cervical: No cervical adenopathy.  Skin:    General: Skin is warm and dry.  Neurological:     Mental Status: He is alert and oriented to person, place, and time.  Psychiatric:        Behavior: Behavior normal.        Thought Content: Thought content normal.        Judgment: Judgment normal.     BP 138/75   Pulse 86   Temp 98.3 F (36.8 C) (Temporal)   Ht 5' 9 (1.753 m)   Wt 233 lb (105.7 kg)   SpO2 96%   BMI 34.41 kg/m    HGBA1c 5.4%      Assessment & Plan:  Harold Bullock comes in today with chief complaint of medical management of chronic issues    Diagnosis and orders  addressed:  1. Primary hypertension Low sodium diet - CBC with Differential/Platelet - CMP14+EGFR - lisinopril  (ZESTRIL ) 20 MG tablet; Take 1 tablet (20 mg total) by mouth daily.  Dispense: 90 tablet; Refill: 1  2. Hyperlipidemia with target LDL less than 70 Low  fat diet - Lipid panel Continue repatha  and vascepa   3. Diet-controlled diabetes mellitus (HCC) Continue to watch diet - Bayer DCA Hb A1c Waived -increase ozempic  to 0.5mg  weekly  4. Screening for prostate cancer Labs pending - PSA, total and free  5. Agatston CAC score 200-399 6. Coronary artery disease, non-occlusive 7. PFO (patent foramen ovale) Keep follow up with cardiology  8. Pulmonary nodule, right Will find out why chest CT has no t been schedyuled yet  9. OSA (obstructive sleep apnea) Continue  to wear cpap at night  10. Obesity (BMI 30.0-34.9) Continue to watch diet and exercise   Labs pending Health Maintenance reviewed Diet and exercise encouraged  Follow up plan: 6 months   [name] Gladis, FNP

## 2024-01-10 LAB — MICROALBUMIN / CREATININE URINE RATIO
Creatinine, Urine: 63 mg/dL
Microalb/Creat Ratio: 45 mg/g{creat} — ABNORMAL HIGH (ref 0–29)
Microalbumin, Urine: 28.3 ug/mL

## 2024-01-22 ENCOUNTER — Ambulatory Visit: Admitting: Cardiology

## 2024-01-29 ENCOUNTER — Telehealth: Payer: Self-pay | Admitting: Pharmacy Technician

## 2024-01-29 ENCOUNTER — Other Ambulatory Visit (HOSPITAL_COMMUNITY): Payer: Self-pay

## 2024-01-29 NOTE — Telephone Encounter (Signed)
 Pharmacy Patient Advocate Encounter   Received notification from Onbase that prior authorization for Ozempic  (0.25 or 0.5 MG/DOSE) 2MG /3ML pen-injectors is due for renewal.   Insurance verification completed.   The patient is insured through [facility].

## 2024-03-25 ENCOUNTER — Ambulatory Visit: Admitting: Physician Assistant

## 2024-04-05 ENCOUNTER — Ambulatory Visit: Admitting: Nurse Practitioner
